# Patient Record
Sex: Female | Born: 1990 | Hispanic: No | Marital: Single | Smoking: Never smoker
Health system: Southern US, Community
[De-identification: ages and names within clinical notes are randomized; demographics above are authoritative.]

## PROBLEM LIST (undated history)

## (undated) DIAGNOSIS — T783XXA Angioneurotic edema, initial encounter: Secondary | ICD-10-CM

## (undated) DIAGNOSIS — L509 Urticaria, unspecified: Secondary | ICD-10-CM

## (undated) HISTORY — DX: Angioneurotic edema, initial encounter: T78.3XXA

## (undated) HISTORY — DX: Urticaria, unspecified: L50.9

## (undated) HISTORY — PX: SINOSCOPY: SHX187

---
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
## 2009-05-04 ENCOUNTER — Ambulatory Visit: Payer: Self-pay | Admitting: Family Medicine

## 2009-07-17 ENCOUNTER — Emergency Department (HOSPITAL_COMMUNITY): Admission: EM | Admit: 2009-07-17 | Discharge: 2009-07-17 | Payer: Self-pay | Admitting: Emergency Medicine

## 2009-07-17 DIAGNOSIS — Z9189 Other specified personal risk factors, not elsewhere classified: Secondary | ICD-10-CM | POA: Insufficient documentation

## 2009-07-24 ENCOUNTER — Ambulatory Visit: Payer: Self-pay | Admitting: Family Medicine

## 2009-09-24 ENCOUNTER — Ambulatory Visit: Payer: Self-pay | Admitting: Family Medicine

## 2009-10-06 HISTORY — PX: TONSILECTOMY/ADENOIDECTOMY WITH MYRINGOTOMY: SHX6125
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
## 2010-07-22 ENCOUNTER — Ambulatory Visit: Payer: Self-pay | Admitting: Family Medicine

## 2010-07-30 ENCOUNTER — Ambulatory Visit: Payer: Self-pay | Admitting: Family Medicine

## 2010-07-30 ENCOUNTER — Encounter: Payer: Self-pay | Admitting: Family Medicine

## 2010-07-30 LAB — CONVERTED CEMR LAB
HDL: 45 mg/dL (ref >39)
LDL Cholesterol: 96 mg/dL (ref 0–99)

## 2010-08-02 ENCOUNTER — Telehealth: Payer: Self-pay | Admitting: Family Medicine

## 2010-08-02 ENCOUNTER — Encounter: Payer: Self-pay | Admitting: Family Medicine

## 2010-10-06 HISTORY — PX: RHINOPLASTY: SHX2354

## 2010-10-06 HISTORY — PX: SEPTOPLASTY: SHX2393

## 2010-10-20 ENCOUNTER — Emergency Department (HOSPITAL_COMMUNITY)
Admission: EM | Admit: 2010-10-20 | Discharge: 2010-10-20 | Payer: Self-pay | Source: Home / Self Care | Admitting: Family Medicine

## 2010-11-05 NOTE — Progress Notes (Signed)
Summary: lab results   Phone Note Call from Patient Call back at Home Phone [phone]   Caller: Patient Summary of Call: pt is wanting lab results Initial call taken by: De Nurse,  August 02, 2010 12:27 PM  Follow-up for Phone Call        Attempted to Call pt. at 1:22 pm today.  No answer.  Will send letter with lab results.   Follow-up by: Ardyth Gal MD,  August 02, 2010 1:23 PM
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
## 2010-11-05 NOTE — Letter (Signed)
Summary: Generic Letter  [facility]  [address]   [place], [place] [address]   Phone: [phone]  Fax: [phone]    08/02/2010  NEVA RAMASWAMY [address], [place]  [address]  Dear Ms. Williams Che,  I have recieved your lab results.  Your fasting blood sugar was normal.  Your total cholesterol was also normal.  However, your triglycerides (a part of cholesterol) was just a little high.  I would encourage you to exercise for at least 30 minutes 5 days a week, and to eat a low fat diet.  You do not need to be on any medications for this.  Please contact the office if you have any questions.    Sincerely,   Ardyth Gal MD  Appended Document: Generic Letter mailed.

## 2011-01-09 LAB — POCT I-STAT, CHEM 8
BUN: 16 mg/dL (ref 6–23)
Calcium, Ion: 1.18 mmol/L (ref 1.12–1.32)
Potassium: 3.8 mEq/L (ref 3.5–5.1)
Sodium: 140 mEq/L (ref 135–145)
TCO2: 26 mmol/L (ref 0–100)

## 2011-01-09 LAB — URINALYSIS, ROUTINE W REFLEX MICROSCOPIC
Bilirubin Urine: NEGATIVE
Hgb urine dipstick: NEGATIVE
Ketones, ur: NEGATIVE mg/dL
Nitrite: NEGATIVE
Protein, ur: NEGATIVE mg/dL
Urobilinogen, UA: 0.2 mg/dL (ref 0.0–1.0)

## 2011-01-09 LAB — POCT PREGNANCY, URINE: Preg Test, Ur: NEGATIVE

## 2011-02-21 NOTE — Op Note (Signed)
NAMECHAUNTAY, PASZKIEWICZ            ACCOUNT NO.:  [id number]   MEDICAL RECORD NO.:  [id number]          PATIENT TYPE:  AMB   LOCATION:  [facility]                          FACILITY:  [facility]   PHYSICIAN:  Christopher E. Ezzard Standing, M.D.DATE OF BIRTH:  Feb 18, 1991   DATE OF PROCEDURE:  09/25/2006  DATE OF DISCHARGE:                               OPERATIVE REPORT   PREOPERATIVE DIAGNOSIS:  Recurrent tonsillitis with moderate  adenotonsillar hypertrophy.   POSTOPERATIVE DIAGNOSIS:  Recurrent tonsillitis with moderate  adenotonsillar hypertrophy.   OPERATION:  Tonsillectomy and adenoidectomy.   SURGEON:  Kristine Garbe. Ezzard Standing, M.D.   ANESTHESIA:  General endotracheal.   COMPLICATIONS:  None.   BRIEF CLINICAL NOTE:  Makynna is a 20-year-old who has had a history of  recurrent tonsil infections for a number of years.  She has had 4-5  infections this past year with a large swollen tonsils.  She is taken to  the operating room at this time for tonsillectomy and possible  adenoidectomy.   DESCRIPTION OF PROCEDURE:  After adequate endotracheal anesthesia,  Ashlee received 1 gram Ancef IV preoperatively as well as 10 mg  Decadron.  A mouth gag was used to expose oropharynx.  The left and  right tonsils were dissected from the tonsillar fossae using cautery.  Care was taken to preserve the anterior posterior tonsillar pillars as  well as the uvula. After obtaining adequate hemostasis, the nasopharynx  was imaged.  Shakenya had moderate adenoid hypertrophy and using suction  cautery, the residual adenoid tissue was cauterized.  This completed the  procedure.  The nose and nasopharynx was irrigated with saline.  Katti  was awakened from anesthesia and transferred to recovery postop doing  well.   DISPOSITION:  Miesha is discharged home later this morning on  amoxicillin suspension 5 mg b.i.d. for one week, Tylenol Lortab elixir 1  to 1 1/2 tbs q.4h. p.r.n. pain.  We will have her follow up in my  office  in 10-14 days for recheck.           ______________________________  Kristine Garbe. Ezzard Standing, M.D.     CEN/MEDQ  D:  09/25/2006  T:  09/25/2006  Job:  [id number]

## 2011-08-15 ENCOUNTER — Ambulatory Visit (INDEPENDENT_AMBULATORY_CARE_PROVIDER_SITE_OTHER): Payer: PRIVATE HEALTH INSURANCE | Admitting: *Deleted

## 2011-08-15 DIAGNOSIS — Z23 Encounter for immunization: Secondary | ICD-10-CM

## 2011-09-11 ENCOUNTER — Ambulatory Visit (INDEPENDENT_AMBULATORY_CARE_PROVIDER_SITE_OTHER): Payer: PRIVATE HEALTH INSURANCE | Admitting: Family Medicine

## 2011-09-11 VITALS — BP 137/92 | HR 113 | Temp 97.9°F | Ht 63.0 in | Wt 149.0 lb

## 2011-09-11 DIAGNOSIS — N921 Excessive and frequent menstruation with irregular cycle: Secondary | ICD-10-CM | POA: Insufficient documentation

## 2011-09-11 DIAGNOSIS — N912 Amenorrhea, unspecified: Secondary | ICD-10-CM

## 2011-09-11 LAB — POCT URINE PREGNANCY: Preg Test, Ur: NEGATIVE

## 2011-09-11 MED ORDER — NORELGESTROMIN-ETH ESTRADIOL 150-35 MCG/24HR TD PTWK: 1.0000 | MEDICATED_PATCH | TRANSDERMAL | Status: DC

## 2011-09-13 ENCOUNTER — Encounter: Payer: Self-pay | Admitting: Family Medicine

## 2011-09-13 NOTE — Progress Notes (Signed)
  Subjective:    Patient ID: Kathryn French, female    DOB: 04-16-1991, 20 y.o.   MRN: [id number]  HPI 20 year old female who presents with complaint of irregular periods and cramping during periods. She was on birth control pills 2 years ago which were prescribed to her for symptoms of metrorrhagia and dysmenorrhea. She was on birth control pills for 1-1/2 years but stopped because she was missing pills. Interval between periods range is between 2 weeks to 6 weeks. Last menstrual period was 2 weeks ago. Periods last one and a half days. They are light and accompanied by cramping. She reports always being irregular since menarche which was at 20 years old. She would like to start birth control again so that her periods can be regular and cramping controlled. Birth control pills helped with these symptoms in the past as well as with control of her acne. She denies any history of headaches or chest pain or shortness of breath using birth control pills in the past. She does not smoke. She reports never being sexually active and wants birth control only to regulate her period. She has done some research and would like to try either the NuvaRing or the patch. She will be leaving the country tomorrow to go to [place].      Review of Systems  negative except per history of present illness     Objective:   Physical Exam Filed Vitals:   09/11/11 1420  BP: 137/92  Pulse: 113  Temp: 97.9 F (36.6 C)    Repeat BP: 104/70 Physical Examination: General appearance - alert, well appearing, and in no distress Chest - clear to auscultation, no wheezes, rales or rhonchi, symmetric air entry Heart - normal rate, regular rhythm, normal S1, S2, no murmurs, rubs, clicks or gallops Abdomen - soft, nontender, nondistended, no masses or organomegaly     Assessment & Plan:

## 2011-09-13 NOTE — Assessment & Plan Note (Addendum)
Patient elected to start patch since she was concerned that inserting the nuvaring in a setting of lower water sanitation like [place] would perhaps be a concern. Patient's blood pressure was originally elevated but on repeat blood pressure was normal. No contraindication to prescribing the patch was therefore found. Patient denied any skin sensitivity to tapes and topical agents. Patient was told about potential of blood clots on the patch and was advised to stay mobile during her long plane flight to [place] given that this would also be a factor in clot formation. Patient was warned about symptoms of stroke and blood clots and advised to stop patch immediately if this were to occur.  No need to evaluate causes of metrorrhagia such as PCOS for now.

## 2013-12-14 ENCOUNTER — Encounter: Payer: Self-pay | Admitting: Family Medicine

## 2013-12-14 ENCOUNTER — Ambulatory Visit (INDEPENDENT_AMBULATORY_CARE_PROVIDER_SITE_OTHER): Payer: BC Managed Care – PPO | Admitting: Family Medicine

## 2013-12-14 VITALS — BP 123/86 | HR 89 | Temp 97.8°F | Wt 168.0 lb

## 2013-12-14 DIAGNOSIS — R21 Rash and other nonspecific skin eruption: Secondary | ICD-10-CM

## 2013-12-14 MED ORDER — TRIAMCINOLONE ACETONIDE 0.1 % EX CREA: 1.0000 "application " | TOPICAL_CREAM | Freq: Two times a day (BID) | CUTANEOUS | Status: DC

## 2013-12-14 NOTE — Patient Instructions (Signed)
Please try benadryl or zyrtec for your itching.  Use the steroid cream on the rash. If the rash does not improve in the next couple of weeks please let us know. If you develop fevers or start to feel ill please let us know.

## 2013-12-14 NOTE — Assessment & Plan Note (Signed)
Patient with rash that appears allergic vs eczematous in nature. Has not tried any medications for this. Discussed use of benadryl or zyrtec for itching. Given prescription for triamcinolone to apply BID until improved. If not better in the next couple of weeks she is to f/u. Discussed return precautions.

## 2013-12-14 NOTE — Progress Notes (Signed)
Patient ID: Kathryn AllegraRafiya French, female   DOB: 12/09/1990, 23 y.o.   MRN: [id number]  Kathryn AlarEric Mohamud Mrozek, MD Phone: [phone]  Kathryn French is a 23 y.o. female who presents today for rash.  Has been present for a couple of weeks. Has been getting worse. 1st on her back and waist, this resolved and now on her right forearm and hand. Itches. No fevers. No changes in soaps, detergents. No new pets. No recent stays in hotels. No history of previous rash. Has not taken anything for this previously. Notes only triggers she could think of may be eggplant or shrimp. 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  Patient is a nonsmoker.  ROS: Per HPI   Physical Exam Filed Vitals:   12/14/13 1553  BP: 123/86  Pulse: 89  Temp: 97.8 F (36.6 C)    Physical Examination: General appearance - alert, well appearing, and in no distress Skin - right forearm with dollar sized patch of erythema and papules, there are excoriations within this area, there is a similar smaller patch over her right 5th MCP joint   Assessment/Plan: Please see individual problem list.

## 2013-12-15 ENCOUNTER — Telehealth: Payer: Self-pay | Admitting: Family Medicine

## 2013-12-15 NOTE — Telephone Encounter (Signed)
I called patients home number to discuss her concern and she was not available. Her father stated she would be in to the office later today to pick up her fathers forms.

## 2013-12-15 NOTE — Telephone Encounter (Signed)
Please call Kathryn French back regarding her rash.  It's getting worse and she need advice.

## 2014-03-21 ENCOUNTER — Telehealth: Payer: Self-pay | Admitting: Family Medicine

## 2014-03-28 ENCOUNTER — Ambulatory Visit: Payer: BC Managed Care – PPO

## 2014-06-05 ENCOUNTER — Telehealth: Payer: Self-pay | Admitting: Family Medicine

## 2014-06-05 NOTE — Telephone Encounter (Signed)
Will forward to MD to see what he suggests.  Jazmin Hartsell,CMA  

## 2014-06-05 NOTE — Telephone Encounter (Signed)
For cough control the patient can try over the counter robitussin or mucinex DM. She should really be seen sooner than that and should be advised that if she has fever, productive cough, shortness of breath, or chest pain she has to been seen sooner than that. Thanks.

## 2014-06-05 NOTE — Telephone Encounter (Signed)
Pt called and made an appointment with Dr. Birdie Sons for 9/15, but in the meantime would like to know what she can do for her cough. She only wanted to see Dr. Birdie Sons. Please call and discuss with her. jw

## 2014-06-05 NOTE — Telephone Encounter (Signed)
LM for patient to call back.  Please give message from MD. Jazmin Hartsell,CMA  

## 2014-06-08 ENCOUNTER — Encounter: Payer: Self-pay | Admitting: Family Medicine

## 2014-06-08 ENCOUNTER — Ambulatory Visit (INDEPENDENT_AMBULATORY_CARE_PROVIDER_SITE_OTHER): Payer: BC Managed Care – PPO | Admitting: Family Medicine

## 2014-06-08 VITALS — BP 110/70 | HR 88 | Temp 98.3°F | Ht 62.75 in | Wt 162.7 lb

## 2014-06-08 DIAGNOSIS — R059 Cough, unspecified: Secondary | ICD-10-CM | POA: Diagnosis not present

## 2014-06-08 DIAGNOSIS — R05 Cough: Secondary | ICD-10-CM | POA: Diagnosis not present

## 2014-06-08 LAB — CBC WITH DIFFERENTIAL/PLATELET
BASOS ABS: 0 10*3/uL (ref 0.0–0.1)
BASOS PCT: 0 % (ref 0–1)
EOS PCT: 1 % (ref 0–5)
Eosinophils Absolute: 0.1 10*3/uL (ref 0.0–0.7)
HEMATOCRIT: 41.1 % (ref 36.0–46.0)
HEMOGLOBIN: 13.6 g/dL (ref 12.0–15.0)
LYMPHS PCT: 28 % (ref 12–46)
Lymphs Abs: 2.3 10*3/uL (ref 0.7–4.0)
MCH: 27.5 pg (ref 26.0–34.0)
MCHC: 33.1 g/dL (ref 30.0–36.0)
MCV: 83.2 fL (ref 78.0–100.0)
Monocytes Absolute: 0.4 10*3/uL (ref 0.1–1.0)
Monocytes Relative: 5 % (ref 3–12)
Neutro Abs: 5.3 10*3/uL (ref 1.7–7.7)
Neutrophils Relative %: 66 % (ref 43–77)
Platelets: 251 10*3/uL (ref 150–400)
RBC: 4.94 MIL/uL (ref 3.87–5.11)
RDW: 13.8 % (ref 11.5–15.5)
WBC: 8.1 10*3/uL (ref 4.0–10.5)

## 2014-06-08 MED ORDER — BENZONATATE 100 MG PO CAPS: 100.0000 mg | ORAL_CAPSULE | Freq: Two times a day (BID) | ORAL | Status: DC | PRN

## 2014-06-08 MED ORDER — AZITHROMYCIN 250 MG PO TABS: ORAL_TABLET | ORAL | Status: DC

## 2014-06-08 NOTE — Patient Instructions (Signed)
Pneumonia Pneumonia is an infection of the lungs.  CAUSES Pneumonia may be caused by bacteria or a virus. Usually, these infections are caused by breathing infectious particles into the lungs (respiratory tract). SIGNS AND SYMPTOMS   Cough.  Fever.  Chest pain.  Increased rate of breathing.  Wheezing.  Mucus production. DIAGNOSIS  If you have the common symptoms of pneumonia, your health care provider will typically confirm the diagnosis with a chest X-ray. The X-ray will show an abnormality in the lung (pulmonary infiltrate) if you have pneumonia. Other tests of your blood, urine, or sputum may be done to find the specific cause of your pneumonia. Your health care provider may also do tests (blood gases or pulse oximetry) to see how well your lungs are working. TREATMENT  Some forms of pneumonia may be spread to other people when you cough or sneeze. You may be asked to wear a mask before and during your exam. Pneumonia that is caused by bacteria is treated with antibiotic medicine. Pneumonia that is caused by the influenza virus may be treated with an antiviral medicine. Most other viral infections must run their course. These infections will not respond to antibiotics.  HOME CARE INSTRUCTIONS   Cough suppressants may be used if you are losing too much rest. However, coughing protects you by clearing your lungs. You should avoid using cough suppressants if you can.  Your health care provider may have prescribed medicine if he or she thinks your pneumonia is caused by bacteria or influenza. Finish your medicine even if you start to feel better.  Your health care provider may also prescribe an expectorant. This loosens the mucus to be coughed up.  Take medicines only as directed by your health care provider.  Do not smoke. Smoking is a common cause of bronchitis and can contribute to pneumonia. If you are a smoker and continue to smoke, your cough may last several weeks after your  pneumonia has cleared.  A cold steam vaporizer or humidifier in your room or home may help loosen mucus.  Coughing is often worse at night. Sleeping in a semi-upright position in a recliner or using a couple pillows under your head will help with this.  Get rest as you feel it is needed. Your body will usually let you know when you need to rest. PREVENTION A pneumococcal shot (vaccine) is available to prevent a common bacterial cause of pneumonia. This is usually suggested for:  People over 65 years old.  Patients on chemotherapy.  People with chronic lung problems, such as bronchitis or emphysema.  People with immune system problems. If you are over 65 or have a high risk condition, you may receive the pneumococcal vaccine if you have not received it before. In some countries, a routine influenza vaccine is also recommended. This vaccine can help prevent some cases of pneumonia.You may be offered the influenza vaccine as part of your care. If you smoke, it is time to quit. You may receive instructions on how to stop smoking. Your health care provider can provide medicines and counseling to help you quit. SEEK MEDICAL CARE IF: You have a fever. SEEK IMMEDIATE MEDICAL CARE IF:   Your illness becomes worse. This is especially true if you are elderly or weakened from any other disease.  You cannot control your cough with suppressants and are losing sleep.  You begin coughing up blood.  You develop pain which is getting worse or is uncontrolled with medicines.  Any of the symptoms   which initially brought you in for treatment are getting worse rather than better.  You develop shortness of breath or chest pain. MAKE SURE YOU:   Understand these instructions.  Will watch your condition.  Will get help right away if you are not doing well or get worse. Document Released: 09/22/2005 Document Revised: 02/06/2014 Document Reviewed: 12/12/2010 [facility] Patient Information 2015  [place], [place]. This information is not intended to replace advice given to you by your health care provider. Make sure you discuss any questions you have with your health care provider.  Pertussis Pertussis (whooping cough) is an infection that causes severe and sudden coughing attacks. CAUSES  Pertussis is caused by bacteria. It is very contagious and spreads to others by the droplets sprayed in the air when an infected person talks, coughs, and sneezes. You may have caught pertussis from inhaling these droplets or from touching a surface where the droplets fell and then touching your mouth or nose. SIGNS AND SYMPTOMS  Early during this infection, symptoms of pertussis are similar to those of the common cold. They include a runny nose, low fever, mild cough, and red, watery eyes. After 1-2 weeks the cold symptoms get better, but the cough worsens and severe and sudden coughing attacks frequently develop. During these attacks people may cough so hard that vomiting occurs. Over the next month to 6 weeks, the cough starts to get better, but it may take as long as 6 months for the cough to go away completely. DIAGNOSIS  Your health care provider will perform a physical exam. The health care provider may take a mucus sample from the nose and throat and a blood sample to help confirm the diagnosis. The health care provider may also take a chest X-ray. TREATMENT  Antibiotic medicines are usually prescribed for this infection. Starting antibiotics quickly may help shorten the illness and make it less contagious. Antibiotics may also be prescribed for everyone living in the same household. Immunization may be recommended for those in the household at risk of developing pertussis. At-risk groups include:  Infants.  Those who have not had their full course of pertussis immunizations.  Those who were immunized but have not had their recent booster shot. Mild coughing may continue for months after the  infection is treated from the remaining soreness and inflammation in the lungs. HOME CARE INSTRUCTIONS   If you were prescribed an antibiotic medicine, finish it all even if you start to feel better.  Do not take cough medicine unless prescribed by your health care provider. Coughing is a protective mechanism that helps keep colored mucus (sputum) and secretions from clogging breathing passages.  Stay away from those who are at risk of developing pertussis for the first 5 days of antibiotic treatment. If no antibiotics are prescribed, stay at home for the first 3 weeks you are coughing.  Do not go to work until you have been treated with antibiotics for 5 days. If no antibiotics are prescribed, do not go to work for the first 3 weeks you are coughing. Inform your workplace that you were diagnosed with pertussis.  Wash your hands often. Those living in the same household should also wash their hands often to avoid spreading the infection.  Avoid substances that may irritate the lungs, such as smoke, aerosols, or fumes. These substances may worsen your coughing.  If you are having a coughing attack:  Raise the head of your mattress to help clear sputum more easily and improve breathing.  Sit  upright.  Use a cool mist humidifier at home to increase air moisture. This will soothe your cough and help loosen sputum. Do not use hot steam.  Rest as much as possible. Normal activity may be gradually resumed.  Drink enough fluids to keep your urine clear or pale yellow. PREVENTION  Pertussis can be prevented with a vaccine and later booster shots. The pertussis vaccine is usually given during childhood. Adults who were not previously vaccinated should be vaccinated as soon as possible. Adults who were previously vaccinated should talk to their health care providers about the need for a booster shot because immunity from the vaccine decreases over time. All of the following persons should consider  receiving a booster dose of pertussis, which is combined with tetanus and diphtheria (Tdap) vaccine:  Pregnant women during each pregnancy, preferably at 27-36 weeks of pregnancy (gestation).  All persons who have or will have close contact with an infant aged less than 12 months. Infants are at highest risk for life-threatening complications from pertussis.  All health care personnel. SEEK MEDICAL CARE IF:   You have persistent vomiting.  You are not able to eat or drink fluids.  You do not seem to be improving.  You have a fever. SEEK IMMEDIATE MEDICAL CARE IF:   Your face turns red or blue during a coughing attack.  You become unconscious after a coughing attack, even if only for a few moments.  Your breathing stops for a period of time (apnea).  You are restless or cannot sleep.  You are listless or sleeping too much. MAKE SURE YOU:  Understand these instructions.   Will watch your condition.   Will get help right away if you are not doing well or get worse. Document Released: 01/17/2013 Document Revised: 02/06/2014 Document Reviewed: 01/17/2013 [facility] Patient Information 2015 [place], [place]. This information is not intended to replace advice given to you by your health care provider. Make sure you discuss any questions you have with your health care provider.  It was a pleasure seeing you today. I will call you with the results of your blood work and chest x-ray once they're available. Have called shoe and azithromycin and Tessalon Perles to take, azithromycin as your antibiotic and a Tessalon Perles to help you with your cough. We'll need to followup with you in 2 weeks.

## 2014-06-08 NOTE — Assessment & Plan Note (Addendum)
Concern for pneumonia versus pertussis. Considering post cough emesis in length of cough presence. Chest x-ray to rule out pneumonia Pertussis PCR>> unable to obtain in the office. CBC Tessalon Perles for cough Z-Pak prescribed

## 2014-06-08 NOTE — Progress Notes (Signed)
   Subjective:    Patient ID: Kathryn French, female    DOB: [birth year], 23 y.o.   MRN: [id number]  HPI Kathryn French is a 23 y.o. same-day appointment  Cough/fever: Patient states she's had a cough for greater than 3 weeks. She has tried over-the-counter medications without significant result. She states she lost her voice one week prior to the onset of her cough. Her cough can produce a thick sputum at times and at other times she has a post tussive emesis. She endorses fluctuating fever, nausea, rhinorrhea, fatigue, cough and posttussive emesis only. She denies diarrhea, shortness of breath or headache. 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 No one at home was sick prior to her illness, however her mother is sick now.  Patient is a nonsmoker  No past medical history on file.   Review of Systems Per history of present illness    Objective:   Physical Exam BP 110/70  Pulse 88  Temp(Src) 98.3 F (36.8 C) (Oral)  Ht 5' 2.75" (1.594 m)  Wt 162 lb 11.2 oz (73.8 kg)  BMI 29.05 kg/m2 Gen: Pleasant female, appears fatigued, in no acute distress, nontoxic in appearance. Cough present. HEENT: AT. [place]. Bilateral TM visualized and normal in appearance. Bilateral eyes without injections or icterus. MMM. Bilateral nares without erythema and swelling. Throat without erythema or exudates.  CV: RRR no murmur Chest: CTAB, no wheeze or crackles. Normal work of breath Abd: Soft. Flat. NTND. BS present. No Masses palpated.  Ext: No erythema. No edema.      Assessment & Plan:

## 2014-06-09 ENCOUNTER — Ambulatory Visit (HOSPITAL_COMMUNITY)
Admission: RE | Admit: 2014-06-09 | Discharge: 2014-06-09 | Disposition: A | Discharge status: Home / Self Care | Payer: BC Managed Care – PPO | Source: Ambulatory Visit | Attending: Family Medicine | Admitting: Family Medicine

## 2014-06-09 ENCOUNTER — Ambulatory Visit: Payer: BC Managed Care – PPO

## 2014-06-09 ENCOUNTER — Telehealth: Payer: Self-pay | Admitting: Family Medicine

## 2014-06-09 DIAGNOSIS — R05 Cough: Secondary | ICD-10-CM

## 2014-06-09 DIAGNOSIS — R059 Cough, unspecified: Secondary | ICD-10-CM | POA: Diagnosis present

## 2014-06-09 NOTE — Telephone Encounter (Signed)
Please call Kathryn French back and inform her that her labs were normal. I see she did not go get an xray as suggested. I encourage her to do so, otherwise we do not know if she has a pneumonia or other potential cause of her symptoms. I hope she is feeling better. Thanks.

## 2014-06-13 ENCOUNTER — Telehealth: Payer: Self-pay | Admitting: Family Medicine

## 2014-06-13 NOTE — Telephone Encounter (Signed)
Message given. She has finished antibotics yesterday. Still has nausea and vomiting Please advise

## 2014-06-13 NOTE — Telephone Encounter (Signed)
Please call Kathryn French, her CXR was without signs of pneumonia. I hope she is feeling better with the antibiotics and tessalon pearls. Please advise her, her cough may continue for weeks after she stops antibiotics. Thanks.

## 2014-06-13 NOTE — Telephone Encounter (Signed)
Left message to return call. She has an appointment tomorrow for nurse visit. If patient is ok to wait until tomorrow she can be evaluated by nurse and added to the cross cover schedule.Busick, Rodena Medin

## 2014-06-14 ENCOUNTER — Ambulatory Visit (INDEPENDENT_AMBULATORY_CARE_PROVIDER_SITE_OTHER): Payer: BC Managed Care – PPO | Admitting: *Deleted

## 2014-06-14 ENCOUNTER — Other Ambulatory Visit: Payer: Self-pay | Admitting: *Deleted

## 2014-06-14 DIAGNOSIS — Z23 Encounter for immunization: Secondary | ICD-10-CM | POA: Diagnosis not present

## 2014-06-14 DIAGNOSIS — N921 Excessive and frequent menstruation with irregular cycle: Secondary | ICD-10-CM

## 2014-06-14 NOTE — Telephone Encounter (Signed)
Pt request to restart birth control patch. Pt has been off patch for about a couple of months.  Clovis Pu, RN

## 2014-06-15 NOTE — Telephone Encounter (Signed)
Please advise the patient that she will need to come in for an office visit prior to restarting this medication. Thanks.

## 2014-06-15 NOTE — Telephone Encounter (Signed)
aptt made.  Pt agreeable. Kathryn French, Kathryn French

## 2014-06-20 ENCOUNTER — Ambulatory Visit: Payer: BC Managed Care – PPO | Admitting: Family Medicine

## 2014-06-22 ENCOUNTER — Ambulatory Visit: Payer: BC Managed Care – PPO | Admitting: Family Medicine

## 2014-07-05 ENCOUNTER — Ambulatory Visit: Payer: BC Managed Care – PPO | Admitting: Family Medicine

## 2014-08-09 ENCOUNTER — Encounter: Payer: BC Managed Care – PPO | Admitting: Family Medicine

## 2014-10-06 HISTORY — PX: WISDOM TOOTH EXTRACTION: SHX21

## 2014-12-11 ENCOUNTER — Other Ambulatory Visit: Payer: Self-pay | Admitting: Family Medicine

## 2014-12-11 MED ORDER — TRIAMCINOLONE ACETONIDE 0.1 % EX CREA: 1.0000 "application " | TOPICAL_CREAM | Freq: Two times a day (BID) | CUTANEOUS | Status: DC

## 2014-12-11 NOTE — Telephone Encounter (Signed)
Patient called and needs a refill on her allergy cream called in. jw

## 2014-12-14 ENCOUNTER — Encounter: Payer: Self-pay | Admitting: Family Medicine

## 2014-12-14 ENCOUNTER — Ambulatory Visit (INDEPENDENT_AMBULATORY_CARE_PROVIDER_SITE_OTHER): Payer: BLUE CROSS/BLUE SHIELD | Admitting: Family Medicine

## 2014-12-14 VITALS — BP 111/80 | HR 86 | Temp 99.7°F | Wt 152.0 lb

## 2014-12-14 DIAGNOSIS — R059 Cough, unspecified: Secondary | ICD-10-CM

## 2014-12-14 DIAGNOSIS — J111 Influenza due to unidentified influenza virus with other respiratory manifestations: Secondary | ICD-10-CM | POA: Insufficient documentation

## 2014-12-14 DIAGNOSIS — R05 Cough: Secondary | ICD-10-CM

## 2014-12-14 MED ORDER — BENZONATATE 100 MG PO CAPS: 100.0000 mg | ORAL_CAPSULE | Freq: Two times a day (BID) | ORAL | Status: DC | PRN

## 2014-12-14 NOTE — Patient Instructions (Signed)
It was good to see you today. You possibly have the flu but definitely have some viral illness. I am prescribing Tessalon Perles for use for your cough. You can also use honey. Rest and drink plenty of warm fluids. You can use ibuprofen or Tylenol for muscle aches. You can use Neti pot or nasal saline for your nasal congestion. Follow-up in 1 week if you're not feeling any better. Seek immediate care if you have trouble breathing or trouble staying hydrated.

## 2014-12-14 NOTE — Assessment & Plan Note (Signed)
Most likely the flu versus other viral illness. Given the fact that symptoms have been present for 4 days now and pt has no chronic medical illness like DM or asthma, will not treat with Tamiflu. Vitals normal, lungs clear. -Tessalon Perles for most bothersome symptom of cough. Honey when necessary. -Rest and warm liquids. Ibuprofen or Tylenol PRN m aches. -Neti pot or nasal saline for congestion. -Handwashing. -Follow-up 1 week if no better or immediately if having trouble breathing or inability to maintain hydration

## 2014-12-14 NOTE — Progress Notes (Signed)
Patient ID: Rebeca AllegraRafiya Devin, female   DOB: 05/07/1991, 24 y.o.   MRN: [id number] Subjective:   CC: Flulike symptoms  HPI:   Patient presents to same-day clinic with 4 days of flulike symptoms including cough with whitish sputum, sneezing, nasal congestion, decreased appetite, vomiting about 2 times per day. She also has had fever since then with Tmax at 102F 2 days ago. Her father has recently had similar symptoms and now her mother has the same symptoms. She is not having diarrhea and in fact has not had a stool in 2 days. She has some body aches for which Tylenol is helping. She is able to tolerate 5-6 cups of water per day. She is urinating normally. She has bilateral abdominal pain when she coughs.  Review of Systems - Per HPI.   PMH- acne, cough, rash, allergic rhinitis, metrorrhagia    Objective:  Physical Exam BP 111/80 mmHg  Pulse 86  Temp(Src) 99.7 F (37.6 C) (Oral)  Wt 152 lb (68.947 kg)  SpO2 99%  LMP 12/05/2014 (Approximate) GEN: NAD Cardiovascular: Regular rate and rhythm, no murmurs rubs or gallops, 2+ bilateral radial pulses Pulmonary: Clear to auscultation bilaterally, normal effort, occasional dry sounding cough HEENT: Atraumatic, normocephalic, sclera clear, extra ocular movements intact, oropharynx clear, moist mucous membranes, neck supple, shoddy anterior cervical lymphadenopathy, TMs clear with retraction bilaterally, no sinus tenderness Abdomen: Soft, nontender, nondistended, NABS Extremities: No lower extremity edema or calf tenderness     Assessment:     Abrey Williams CheChowdhury is a 24 y.o. female here for  flulike symptoms.    Plan:     # See problem list and after visit summary for problem-specific plans.   Follow-up: Follow up in 1 week if no improvement in flulike symptoms.   Leona SingletonMaria T Anay Walter, MD [facility]

## 2014-12-21 ENCOUNTER — Other Ambulatory Visit: Payer: Self-pay | Admitting: Family Medicine

## 2014-12-21 DIAGNOSIS — R059 Cough, unspecified: Secondary | ICD-10-CM

## 2014-12-21 DIAGNOSIS — R05 Cough: Secondary | ICD-10-CM

## 2014-12-21 NOTE — Telephone Encounter (Signed)
Needs refill on cough medicine

## 2014-12-22 MED ORDER — BENZONATATE 100 MG PO CAPS
100.0000 mg | ORAL_CAPSULE | Freq: Two times a day (BID) | ORAL | Status: DC | PRN
Start: 2014-12-22 — End: 2015-03-26

## 2015-02-05 IMAGING — CR DG CHEST 2V
2 series · 2 of 2 positions shown · non-contrast
Comparison: None.

CLINICAL DATA: Chest congestion, tightness, cough

EXAM:
CHEST  2 VIEW

[w chest pa]
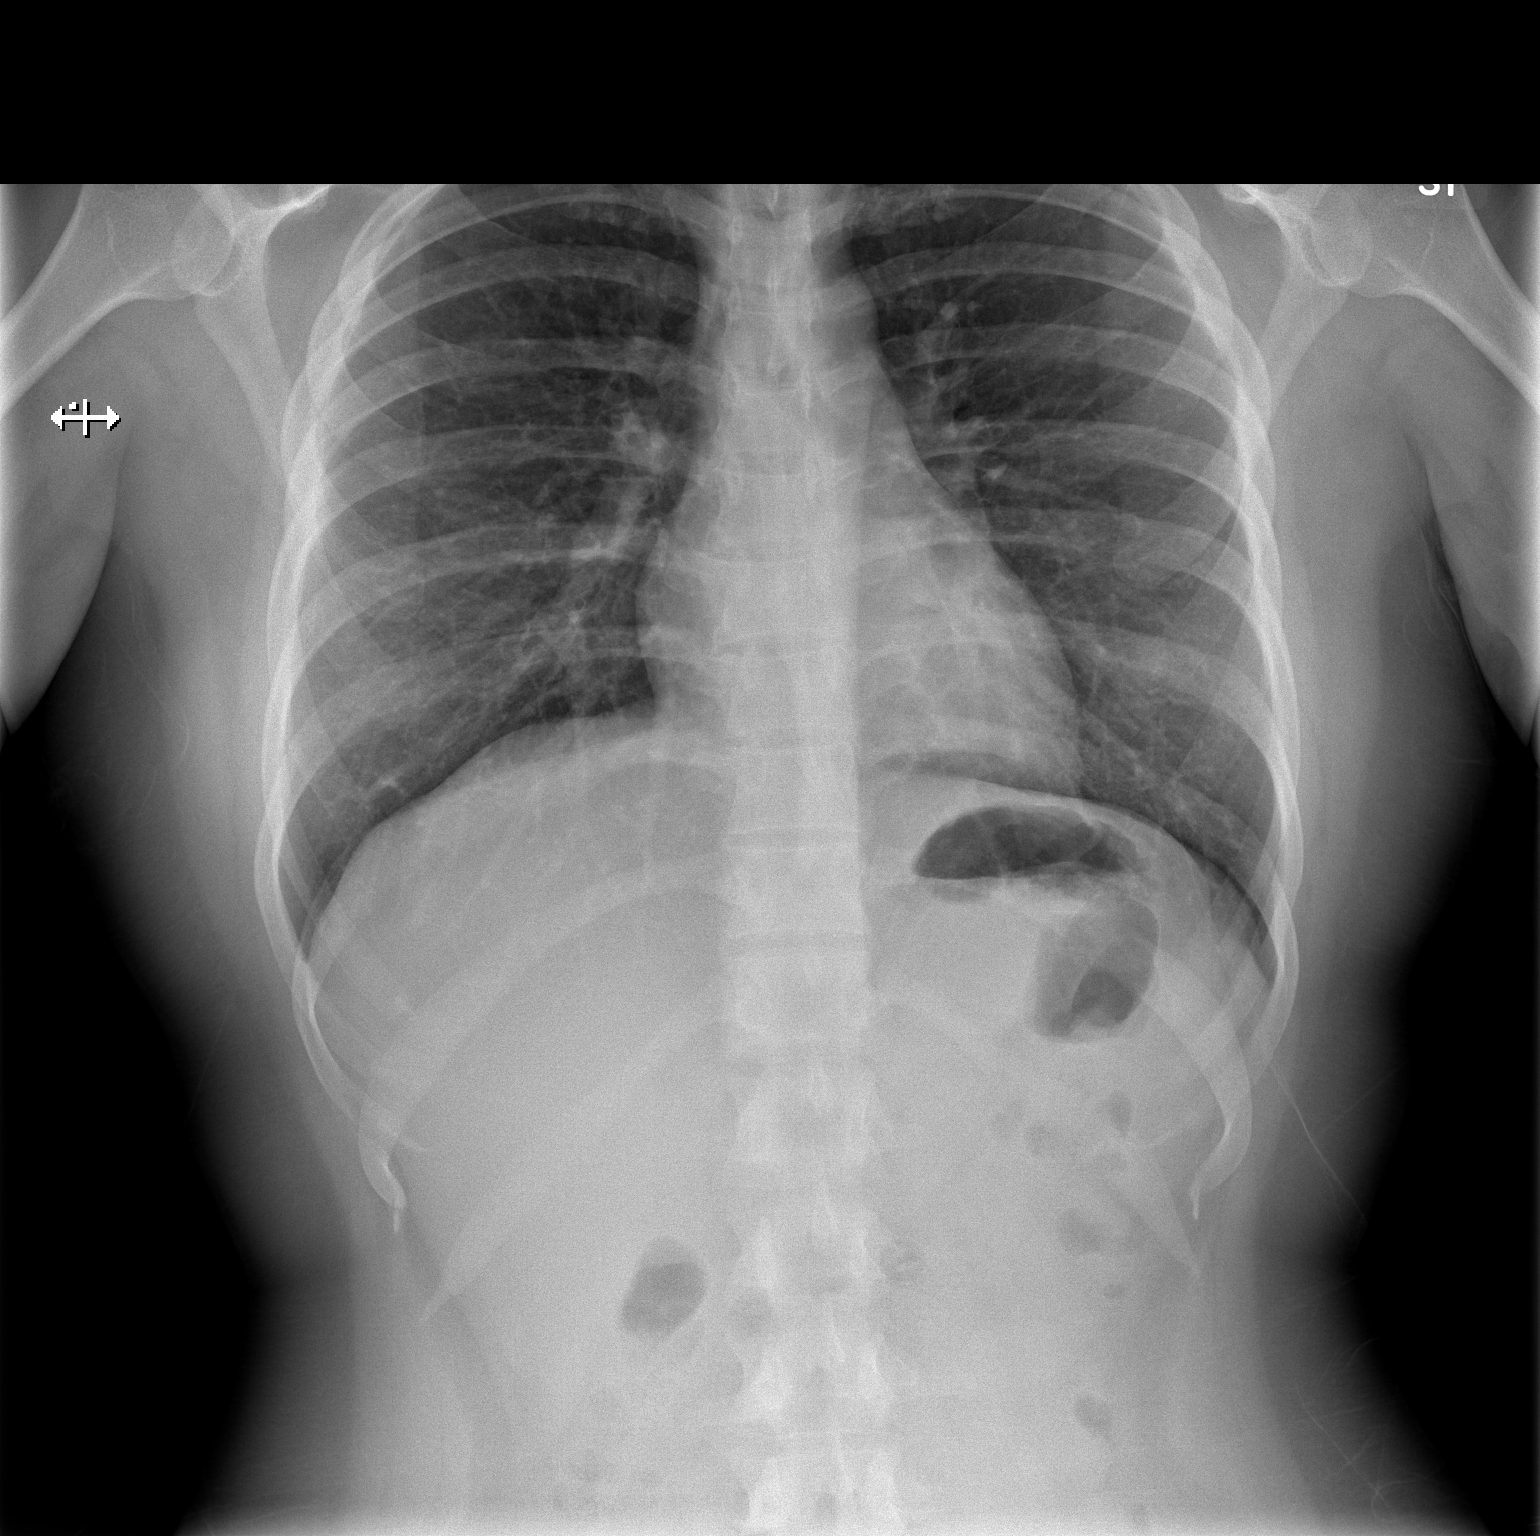

[w chest lat]
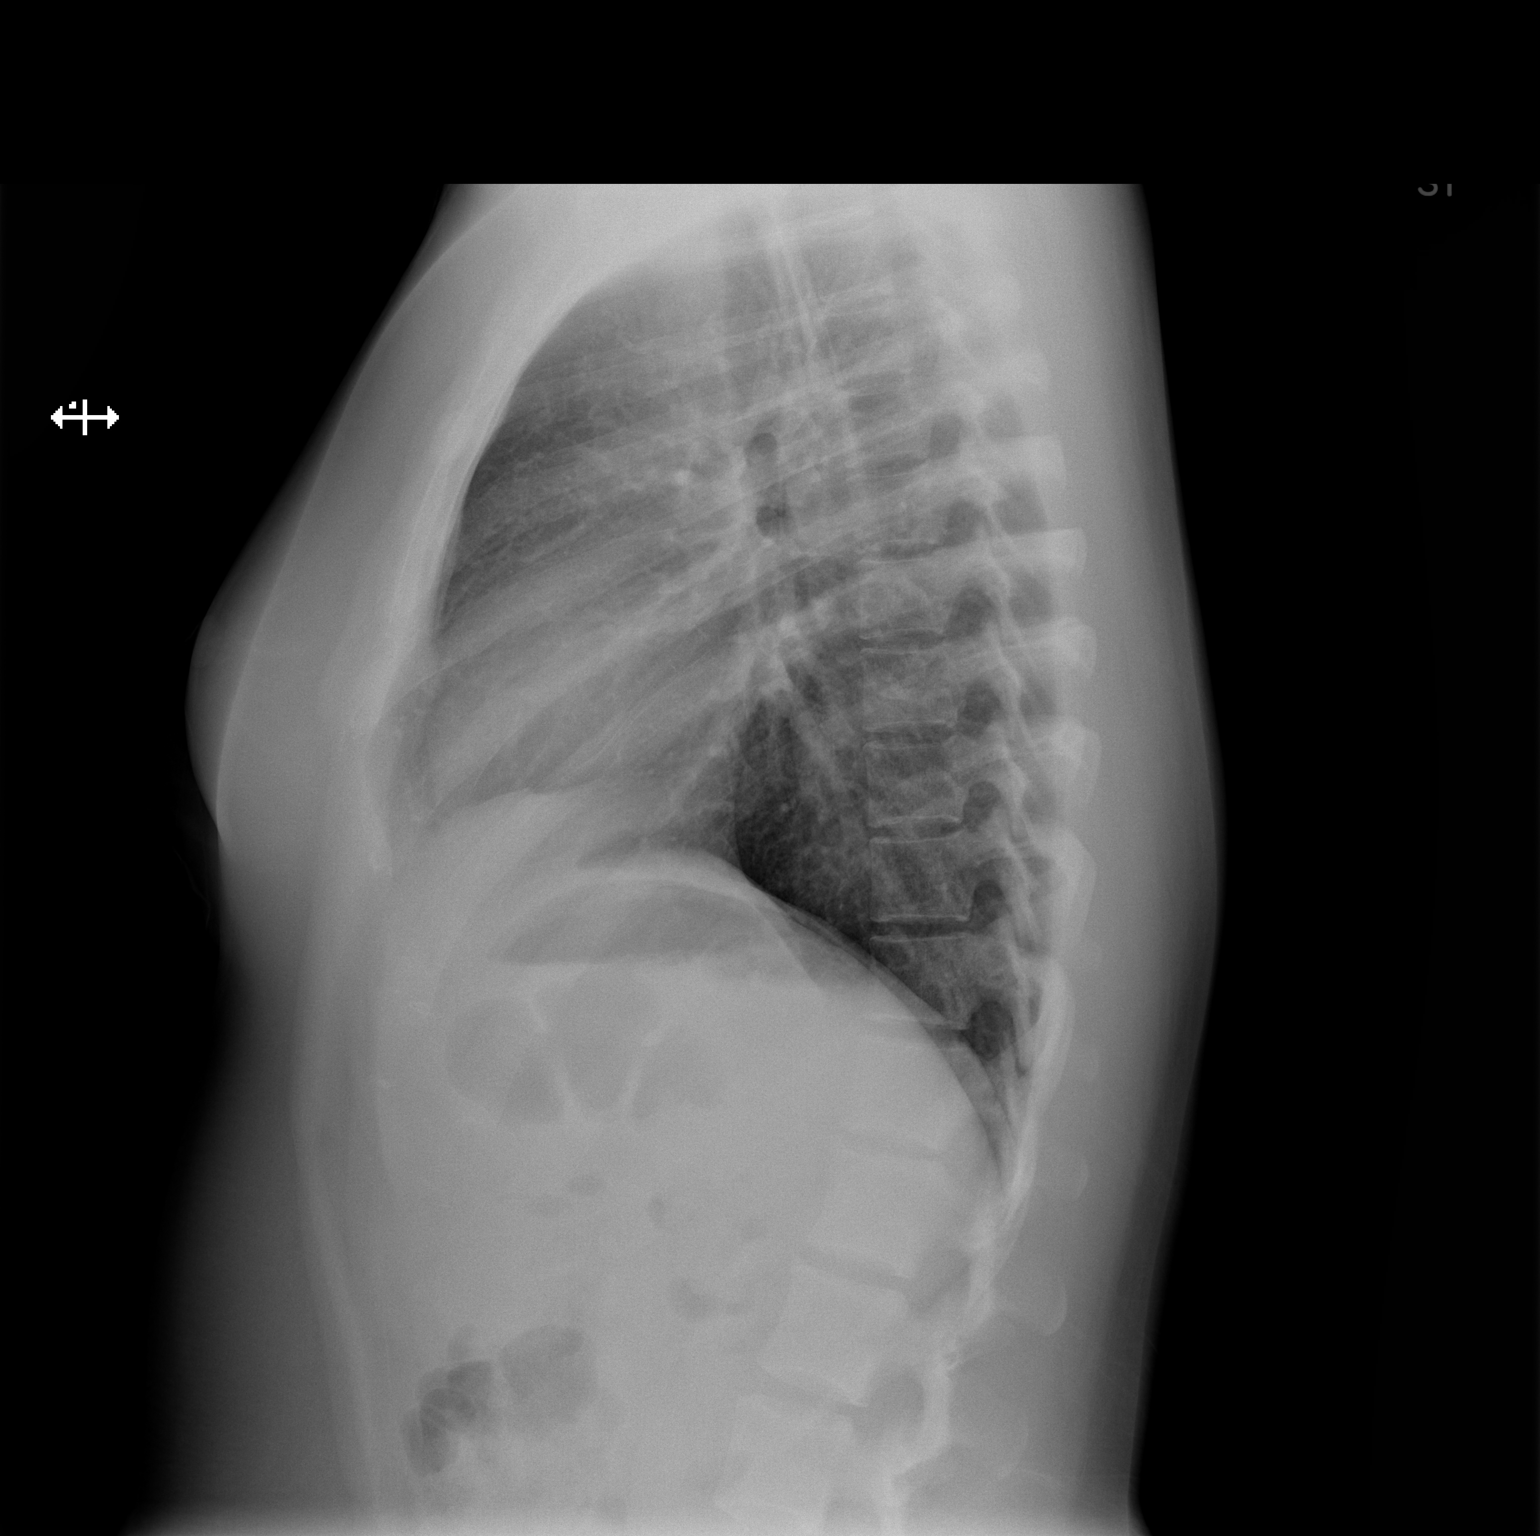

[2 of 2 positions shown; findings below may reference images not displayed]

FINDINGS: No active infiltrate or effusion is seen. Mediastinal and hilar
contours appear normal. The heart is within normal limits in size.
No bony abnormality is seen.
IMPRESSION: No active cardiopulmonary disease.

## 2015-02-21 ENCOUNTER — Encounter: Payer: BLUE CROSS/BLUE SHIELD | Admitting: Family Medicine

## 2015-03-26 ENCOUNTER — Other Ambulatory Visit: Payer: Self-pay | Admitting: Family Medicine

## 2015-03-26 DIAGNOSIS — R05 Cough: Secondary | ICD-10-CM

## 2015-03-26 DIAGNOSIS — R059 Cough, unspecified: Secondary | ICD-10-CM

## 2015-03-26 MED ORDER — BENZONATATE 100 MG PO CAPS: 100.0000 mg | ORAL_CAPSULE | Freq: Two times a day (BID) | ORAL | Status: DC | PRN

## 2015-03-26 NOTE — Telephone Encounter (Signed)
Patient needs follow-up if still has cough. Refill given. Please inform the patient.

## 2015-03-26 NOTE — Telephone Encounter (Signed)
Letter mailed to patient. Jazmin Hartsell,CMA  

## 2015-03-26 NOTE — Telephone Encounter (Signed)
Asking for refill on tessalon pearls, still has a cough, Pt goes to [facility]

## 2015-08-06 ENCOUNTER — Telehealth: Payer: Self-pay | Admitting: Family Medicine

## 2015-08-06 NOTE — Telephone Encounter (Signed)
Pt made appt. No message needed. Closing. Kathryn French, ASA

## 2015-08-29 ENCOUNTER — Ambulatory Visit: Payer: BLUE CROSS/BLUE SHIELD | Admitting: Family Medicine

## 2018-10-22 ENCOUNTER — Ambulatory Visit (INDEPENDENT_AMBULATORY_CARE_PROVIDER_SITE_OTHER): Payer: 59 | Admitting: Family Medicine

## 2018-10-22 ENCOUNTER — Ambulatory Visit (INDEPENDENT_AMBULATORY_CARE_PROVIDER_SITE_OTHER): Payer: 59

## 2018-10-22 ENCOUNTER — Ambulatory Visit: Payer: 59

## 2018-10-22 ENCOUNTER — Encounter: Payer: Self-pay | Admitting: Family Medicine

## 2018-10-22 VITALS — BP 115/79 | HR 90 | Temp 98.2°F | Ht 62.75 in | Wt 161.8 lb

## 2018-10-22 DIAGNOSIS — R05 Cough: Secondary | ICD-10-CM

## 2018-10-22 DIAGNOSIS — R6889 Other general symptoms and signs: Secondary | ICD-10-CM

## 2018-10-22 DIAGNOSIS — R059 Cough, unspecified: Secondary | ICD-10-CM

## 2018-10-22 DIAGNOSIS — J0100 Acute maxillary sinusitis, unspecified: Secondary | ICD-10-CM | POA: Diagnosis not present

## 2018-10-22 LAB — POC INFLUENZA A&B (BINAX/QUICKVUE)
Influenza A, POC: NEGATIVE
Influenza B, POC: NEGATIVE

## 2018-10-22 MED ORDER — AMOXICILLIN-POT CLAVULANATE 875-125 MG PO TABS: 1.0000 | ORAL_TABLET | Freq: Two times a day (BID) | ORAL | 0 refills | Status: DC

## 2018-10-22 MED ORDER — BENZONATATE 100 MG PO CAPS
100.0000 mg | ORAL_CAPSULE | Freq: Three times a day (TID) | ORAL | 0 refills | Status: DC | PRN
Start: 2018-10-22 — End: 2019-04-21

## 2018-10-22 NOTE — Progress Notes (Signed)
1/17/202012:29 PM  Kathryn French May 09, 1991, 28 y.o. female [id number]  Chief Complaint  Patient presents with  . Cough    coughing causing chest pain, had some fever and chills. Has been feeling this way for the past wk    HPI:   Patient is a 28 y.o. female who presents today for nasal congestion, coughing, chest pain  Got sick on 12/30, was getting better however a week ago started having productive cough, lots of nasal congestion and left sided facial pain, fever, chills, body aches, fever, night sweats Feels it is worsening Had flu vaccine this season Denies h/o asthma Doe not smoke Denies any SOB Has vomited a couple of times after cough Denies any abd pain or diarrhea   Fall Risk  06/08/2014  Falls in the past year? No     Depression screen [facility] 2/9 12/14/2014 06/08/2014  Decreased Interest 0 0  Down, Depressed, Hopeless 0 0  PHQ - 2 Score 0 0    No Known Allergies  Prior to Admission medications   Medication Sig Start Date End Date Taking? Authorizing Provider  azithromycin (ZITHROMAX Z-PAK) 250 MG tablet Take taper pack as directed by pharmacy 06/08/14   Felix Pacini A, DO  benzonatate (TESSALON) 100 MG capsule Take 1 capsule (100 mg total) by mouth 2 (two) times daily as needed for cough. 03/26/15   Glori Luis, MD  triamcinolone cream (KENALOG) 0.1 % Apply 1 application topically 2 (two) times daily. 12/11/14   Glori Luis, MD    No past medical history on file.  No past surgical history on file.  Social History   Tobacco Use  . Smoking status: Never Smoker  Substance Use Topics  . Alcohol use: Not on file    No family history on file.  ROS Per hpi  OBJECTIVE:  Blood pressure 115/79, pulse 90, temperature 98.2 F (36.8 C), temperature source Oral, height 5' 2.75" (1.594 m), weight 161 lb 12.8 oz (73.4 kg), SpO2 95 %. Body mass index is 28.89 kg/m.   Physical Exam Vitals signs and nursing note reviewed.  Constitutional:    Appearance: She is well-developed.  HENT:     Head: Normocephalic and atraumatic.     Right Ear: Hearing, tympanic membrane, ear canal and external ear normal.     Left Ear: Hearing, tympanic membrane, ear canal and external ear normal.     Nose:     Left Sinus: Maxillary sinus tenderness and frontal sinus tenderness present.  Eyes:     Conjunctiva/sclera: Conjunctivae normal.     Pupils: Pupils are equal, round, and reactive to light.  Neck:     Musculoskeletal: Neck supple.  Cardiovascular:     Rate and Rhythm: Normal rate and regular rhythm.     Heart sounds: Normal heart sounds. No murmur. No friction rub. No gallop.   Pulmonary:     Effort: Pulmonary effort is normal.     Breath sounds: Normal breath sounds. No wheezing or rales.  Lymphadenopathy:     Cervical: No cervical adenopathy.  Skin:    General: Skin is warm and dry.  Neurological:     Mental Status: She is alert and oriented to person, place, and time.      Results for orders placed or performed in visit on 10/22/18 (from the past 24 hour(s))  POC Influenza A&B(BINAX/QUICKVUE)     Status: None   Collection Time: 10/22/18 11:38 AM  Result Value Ref Range   Influenza A, POC  Negative Negative   Influenza B, POC Negative Negative    Dg Chest 2 View  Result Date: 10/22/2018 CLINICAL DATA:  Cough EXAM: CHEST - 2 VIEW COMPARISON:  06/09/2014 FINDINGS: Normal mediastinum and cardiac silhouette. Normal pulmonary vasculature. No evidence of effusion, infiltrate, or pneumothorax. No acute bony abnormality. IMPRESSION: No acute cardiopulmonary process. Electronically Signed   By: Genevive Bi M.D.   On: 10/22/2018 12:05     ASSESSMENT and PLAN  1. Acute non-recurrent maxillary sinusitis Discussed supportive measures, new meds r/se/b and RTC precautions. Patient educational handout given.  2. Flu-like symptoms - POC Influenza A&B(BINAX/QUICKVUE)  3. Cough - DG Chest 2 View; Future  Other orders -  amoxicillin-clavulanate (AUGMENTIN) 875-125 MG tablet; Take 1 tablet by mouth 2 (two) times daily. - benzonatate (TESSALON) 100 MG capsule; Take 1-2 capsules (100-200 mg total) by mouth 3 (three) times daily as needed for cough.   Return if symptoms worsen or fail to improve.    Myles Lipps, MD [facility] [address] [place], [place] [address] Ph.  [phone] Fax [phone]

## 2018-10-22 NOTE — Patient Instructions (Signed)

## 2018-11-29 ENCOUNTER — Ambulatory Visit: Payer: Self-pay | Admitting: Family Medicine

## 2018-12-21 ENCOUNTER — Encounter: Payer: Self-pay | Admitting: Family Medicine

## 2018-12-21 ENCOUNTER — Ambulatory Visit (INDEPENDENT_AMBULATORY_CARE_PROVIDER_SITE_OTHER): Payer: 59 | Admitting: Family Medicine

## 2018-12-21 ENCOUNTER — Other Ambulatory Visit: Payer: Self-pay

## 2018-12-21 VITALS — BP 110/60 | HR 70 | Temp 98.4°F | Ht 63.0 in | Wt 166.0 lb

## 2018-12-21 DIAGNOSIS — R5383 Other fatigue: Secondary | ICD-10-CM | POA: Diagnosis not present

## 2018-12-21 DIAGNOSIS — Z13220 Encounter for screening for lipoid disorders: Secondary | ICD-10-CM | POA: Diagnosis not present

## 2018-12-21 DIAGNOSIS — R05 Cough: Secondary | ICD-10-CM

## 2018-12-21 DIAGNOSIS — R059 Cough, unspecified: Secondary | ICD-10-CM

## 2018-12-21 DIAGNOSIS — Z114 Encounter for screening for human immunodeficiency virus [HIV]: Secondary | ICD-10-CM

## 2018-12-21 DIAGNOSIS — R635 Abnormal weight gain: Secondary | ICD-10-CM | POA: Insufficient documentation

## 2018-12-21 LAB — POCT GLYCOSYLATED HEMOGLOBIN (HGB A1C): Hemoglobin A1C: 5.7 % — AB (ref 4.0–5.6)

## 2018-12-21 MED ORDER — LORATADINE 10 MG PO TABS: 10.0000 mg | ORAL_TABLET | Freq: Every day | ORAL | 11 refills | Status: DC

## 2018-12-21 MED ORDER — FLUTICASONE PROPIONATE 50 MCG/ACT NA SUSP: 2.0000 | Freq: Every day | NASAL | 6 refills | Status: DC

## 2018-12-21 NOTE — Progress Notes (Addendum)
Phone: [phone]  Subjective:  CC --establish care as new patient; With complaints of weight gain and cough Pt reports: She has had weight gain of 20 pounds over the past 2 years.  Patient states that she started a vegetarian diet in December 2015 but was told that she did not have enough proteins that started adding back white meat.  States that she exercises 2-3 times per week and usually has a homemade smoothie for breakfast, salad for lunch and his typical [place] meal with rice vegetables for dinner.  Patient states that her weight gain has caused her much distress and she is concerned that something else may be going on.  Patient states she is also feeling fatigued on and off as well as having moments where she is feeling cold however body also feeling warm.  Patient does state that rice is a staple of her diet and she eats this nightly.  Chronic cough Patient reports that she developed a cough on 12/30 that carried on through the years at which point it got a lot worse.  Patient was seen at [facility] in January and was diagnosed with sinusitis.  Patient had chest x-ray done at that time which was normal.  States that her cough is nonproductive.  It has been getting better since she took antibiotics and January.  Patient reports that she has been trying [employer], honey and cough drops without much success.  States that her cough is worse during the day, it is better at night.  Does not have any association with what she is eating.  States that she has a history of seasonal allergies and was previously on Zyrtec and Flonase which she has not taken in the past 2 years.   Cardiovascular: - Dx Hypertension: no  - Dx Hyperlipidemia: no, patient with family history of hyperlipidemia in mother and father and has never been tested.  Would like to have lipid panel checked today given that she is also having weight gain. - Physical Activity: yes, goes to the gym 2-3 times per week, and does Zumba 2  times per week - Diabetes: Patient with mother and father who both have diabetes, patient reports that she has been experiencing polydipsia and polyuria and was concerned that she may have diabetes.  Cancer: Lung >> Tobacco Use: no  Cervical/Endometrial >>  - Postmenopausal: no  - Vaginal Bleeding: no - Pap Smear: Patient has never had a Pap smear  Social: Alcohol Use: no  Tobacco Use: no  Other Drugs: no  Risky Sexual Behavior: Patient has never been sexually active Depression: Has never been diagnosed that patient is concerned   Office Visit from 12/21/2018 in [facility]  PHQ-9 Total Score  8     Support and Life at Home: yes   Review of Systems -  Review of Systems  Constitutional: Negative for fever and weight loss.  Respiratory: Positive for cough. Negative for sputum production, shortness of breath and wheezing.   Cardiovascular: Negative for chest pain and orthopnea.  Gastrointestinal: Negative for abdominal pain, blood in stool, constipation, diarrhea, heartburn, nausea and vomiting.  Genitourinary: Positive for frequency.  Neurological: Positive for headaches. Negative for dizziness.  Endo/Heme/Allergies: Positive for polydipsia.  All other systems reviewed and are negative.   Past Medical History Patient Active Problem List   Diagnosis Date Noted  . Weight gain 12/21/2018  . Fatigue 12/21/2018  . Cough 06/08/2014  . RHINITIS, ALLERGIC 12/03/2006    Medications- reviewed and updated  Current Outpatient Medications  Medication Sig Dispense Refill  . amoxicillin-clavulanate (AUGMENTIN) 875-125 MG tablet Take 1 tablet by mouth 2 (two) times daily. 14 tablet 0  . azithromycin (ZITHROMAX Z-PAK) 250 MG tablet Take taper pack as directed by pharmacy 6 each 0  . benzonatate (TESSALON) 100 MG capsule Take 1 capsule (100 mg total) by mouth 2 (two) times daily as needed for cough. 20 capsule 0  . benzonatate (TESSALON) 100 MG capsule Take 1-2  capsules (100-200 mg total) by mouth 3 (three) times daily as needed for cough. 40 capsule 0  . fluticasone (FLONASE) 50 MCG/ACT nasal spray Place 2 sprays into both nostrils daily. 16 g 6  . loratadine (CLARITIN) 10 MG tablet Take 1 tablet (10 mg total) by mouth daily. 30 tablet 11  . triamcinolone cream (KENALOG) 0.1 % Apply 1 application topically 2 (two) times daily. 30 g 0   No current facility-administered medications for this visit.     Objective: BP 110/60   Pulse 70   Temp 98.4 F (36.9 C) (Oral)   Ht  (1.6 m)   Wt 166 lb (75.3 kg)   LMP 12/15/2018   BMI 29.41 kg/m  Gen: NAD, alert, cooperative with exam HEENT: NCAT, EOMI, PERRL CV: RRR, good S1/S2, no murmur Resp: CTABL, no wheezes, non-labored Abd: Soft, Non Tender, Non Distended Ext: No edema, warm Neuro: Alert and oriented, No gross deficits Psych: normal affect, normal speech   Assessment/Plan:  Cough Patient with continued cough.  With normal CXR on 10/22/2018 with no signs of pneumonia.  Exam not concerning for pneumonia and patient reports that her cough is improving.  Patient has history of allergic rhinitis and has not been taking any medication for this but states that she has had cough from this before.  Patient denies any symptoms of GERD.  Will start patient on Zyrtec and Flonase to see if this improves her cough and she is to return if she does not have resolution of her symptoms.  Weight gain Patient encouraged to continue exercising regularly as well as eating plenty of vegetables in her diet.  Patient encouraged to decrease the amount of rice that she is eating and states that it is only about a half a cup. -Based on patient history of also feeling fatigued with cold intolerance will check TSH to rule out thyroid as cause of her unintentional weight gain. -PHQ 9 with a score of 8 which could also be adding to her unintentional weight gain. Will reevaluate at follow-up visit to follow along with  repeat PHQ 9. -Will also check lipid panel given patient's family history as well as weight gain and never previously being screened  Fatigue Given patient's fatigue will check BMP and TSH.  Patient also due for routine screening of HIV, however she is low risk. Will also screen patient today for diabetes given strong family history as well as patient endorsing polyuria and polydipsia.   Orders Placed This Encounter  Procedures  . Lipid panel  . TSH  . HIV Antibody (routine testing w rflx)  . Basic metabolic panel  . POCT glycosylated hemoglobin (Hb A1C)    Meds ordered this encounter  Medications  . fluticasone (FLONASE) 50 MCG/ACT nasal spray    Sig: Place 2 sprays into both nostrils daily.    Dispense:  16 g    Refill:  6  . loratadine (CLARITIN) 10 MG tablet    Sig: Take 1 tablet (10 mg total) by mouth daily.  Dispense:  30 tablet    Refill:  11    [place] Markea Ruzich, DO Family Medicine Resident PGY-2

## 2018-12-21 NOTE — Patient Instructions (Signed)
Thank you for coming to see me today. It was a pleasure!   We will call you with abnormal lab results.   Please follow-up with me as needed.  If you have any questions or concerns, please do not hesitate to call the office at [phone].  Take Care,   [place] Joylene Wescott, DO

## 2018-12-21 NOTE — Assessment & Plan Note (Addendum)
Patient encouraged to continue exercising regularly as well as eating plenty of vegetables in her diet.  Patient encouraged to decrease the amount of rice that she is eating and states that it is only about a half a cup. -Based on patient history of also feeling fatigued with cold intolerance will check TSH to rule out thyroid as cause of her unintentional weight gain. -PHQ 9 with a score of 8 which could also be adding to her unintentional weight gain. Will reevaluate at follow-up visit to follow along with repeat PHQ 9. -Will also check lipid panel given patient's family history as well as weight gain and never previously being screened

## 2018-12-21 NOTE — Assessment & Plan Note (Signed)
Patient with continued cough.  With normal CXR on 10/22/2018 with no signs of pneumonia.  Exam not concerning for pneumonia and patient reports that her cough is improving.  Patient has history of allergic rhinitis and has not been taking any medication for this but states that she has had cough from this before.  Patient denies any symptoms of GERD.  Will start patient on Zyrtec and Flonase to see if this improves her cough and she is to return if she does not have resolution of her symptoms.

## 2018-12-21 NOTE — Assessment & Plan Note (Addendum)
Given patient's fatigue will check BMP and TSH.  Patient also due for routine screening of HIV, however she is low risk. Will also screen patient today for diabetes given strong family history as well as patient endorsing polyuria and polydipsia.

## 2018-12-22 LAB — LIPID PANEL
CHOL/HDL RATIO: 4.5 ratio — AB (ref 0.0–4.4)
Cholesterol, Total: 197 mg/dL (ref 100–199)
HDL: 44 mg/dL (ref >39)
TRIGLYCERIDES: 413 mg/dL — AB (ref 0–149)

## 2018-12-22 LAB — TSH: TSH: 1.69 u[IU]/mL (ref 0.450–4.500)

## 2018-12-22 LAB — BASIC METABOLIC PANEL
BUN/Creatinine Ratio: 20 (ref 9–23)
BUN: 11 mg/dL (ref 6–20)
CO2: 25 mmol/L (ref 20–29)
Calcium: 9.2 mg/dL (ref 8.7–10.2)
Chloride: 104 mmol/L (ref 96–106)
Creatinine, Ser: 0.54 mg/dL — ABNORMAL LOW (ref 0.57–1.00)
GFR calc Af Amer: 149 mL/min/{1.73_m2} (ref >59)
GFR calc non Af Amer: 129 mL/min/{1.73_m2} (ref >59)
GLUCOSE: 108 mg/dL — AB (ref 65–99)
Potassium: 4.6 mmol/L (ref 3.5–5.2)
Sodium: 142 mmol/L (ref 134–144)

## 2018-12-22 LAB — HIV ANTIBODY (ROUTINE TESTING W REFLEX): HIV Screen 4th Generation wRfx: NONREACTIVE

## 2018-12-23 ENCOUNTER — Encounter: Payer: Self-pay | Admitting: Family Medicine

## 2018-12-23 DIAGNOSIS — R7303 Prediabetes: Secondary | ICD-10-CM | POA: Insufficient documentation

## 2018-12-23 HISTORY — DX: Prediabetes: R73.03

## 2019-04-21 ENCOUNTER — Ambulatory Visit (INDEPENDENT_AMBULATORY_CARE_PROVIDER_SITE_OTHER): Payer: 59 | Admitting: Family Medicine

## 2019-04-21 ENCOUNTER — Other Ambulatory Visit: Payer: Self-pay

## 2019-04-21 VITALS — BP 114/60 | HR 73 | Ht 63.0 in | Wt 163.2 lb

## 2019-04-21 DIAGNOSIS — R21 Rash and other nonspecific skin eruption: Secondary | ICD-10-CM

## 2019-04-21 DIAGNOSIS — Z3046 Encounter for surveillance of implantable subdermal contraceptive: Secondary | ICD-10-CM

## 2019-04-21 DIAGNOSIS — Z30019 Encounter for initial prescription of contraceptives, unspecified: Secondary | ICD-10-CM

## 2019-04-21 DIAGNOSIS — Z30017 Encounter for initial prescription of implantable subdermal contraceptive: Secondary | ICD-10-CM

## 2019-04-21 DIAGNOSIS — Z3009 Encounter for other general counseling and advice on contraception: Secondary | ICD-10-CM

## 2019-04-21 LAB — POCT URINE PREGNANCY: Preg Test, Ur: NEGATIVE

## 2019-04-21 MED ORDER — HYDROCORTISONE 2.5 % EX OINT: TOPICAL_OINTMENT | Freq: Two times a day (BID) | CUTANEOUS | 3 refills | Status: DC

## 2019-04-21 NOTE — Progress Notes (Signed)
Subjective:  Patient ID: Kathryn French  DOB: 1991/01/24 MRN: [id number]  Kathryn French is a 28 y.o. female with a PMH of allergies, here today for rash.   HPI:  Rash: -Patient reports that the rash started 2 days ago and is present on her legs, arms, back and buttocks.  She has been using an anti-itch cream that has helped a little bit.  She states that she had this years before and at that time had an allergy test. -She reports no new medications, no fevers, no chills. -She does think that she has had a recent change in body wash.  She started using a new Shea butter with honey and milk about 1-1/2 weeks ago.  She has been taking her Claritin regularly. - She has previously seen an allergist who states that she is allergic to trees, pollen, spiders and cockroaches.   Desire for Nexplanon contraception -She reports that she has previously used the patch and pills for her irregular periods and cramping.  She states that she was never very good at taking the pills and the patch caused a rash.  She has looked at many options over the past few months and would like to have a Nexplanon placed. -Discussed the risks and benefits with patient of the Nexplanon. -She states that she would still like to have it placed today. -Patient is a non-smoker with no history of blood clots.  Her blood pressure has been normal at her last 2 visits.  ROS: as mentioned in HPI   Social hx: Denies use of illicit drugs, alcohol use Smoking status reviewed  Patient Active Problem List   Diagnosis Date Noted  . Prediabetes 12/23/2018  . Weight gain 12/21/2018  . Fatigue 12/21/2018  . Rash 12/14/2013  . RHINITIS, ALLERGIC 12/03/2006     Objective:  BP 114/60   Pulse 73   Ht 5\' 3"  (1.6 m)   Wt 163 lb 4 oz (74 kg)   LMP 04/20/2019   SpO2 98%   BMI 28.92 kg/m   Vitals and nursing note reviewed  General: NAD, pleasant, well-appearing Pulm: normal effort Extremities: no edema or cyanosis. WWP.  Skin: warm and dry, wheels along upper thigh with no noted excoriation with some also noted on forarms b/l.  Neuro: alert and oriented, no focal deficits Psych: normal affect, normal thought content  Assessment & Plan:   Rash Patient with rash that appears allergic versus eczematous in nature.  She has tried Gold Bond anti-itch cream which has helped with the itching.  She has been using her Claritin daily.  She has no red flag symptoms.  Given prescription for hydrocortisone cream to apply twice daily until improved.  If it is not better in the next couple of weeks she is to follow-up.  She may also message me on my chart in order to have a referral to an allergist.   Discussed return precautions, patient with understanding.  PROCEDURE NOTE: [place] Patient given informed consent and signed copy in the chart.  Pregnancy test was  negative  Appropriate time out was taken. Left  arm was prepped and draped in the usual sterile fashion. Appropriate measurement was made for insertion of nexplanon and landmarks identified, insertion site marked. Two cc of 1%lidocaine  was used for local anesthesia. Once anesthesia obtained, nexplanon was inserted in typical fashion. No complications. Pressure bandage applied to decrease bruising. Patient given follow up instructions should she experience redness, swelling at sight or fever in the next  24 hours. Patient given Nexplanon pocket card.  [place] Ryelynn Guedea, DO Family Medicine Resident PGY-3

## 2019-04-21 NOTE — Assessment & Plan Note (Addendum)
Patient with rash that appears allergic versus eczematous in nature.  She has tried Gold Bond anti-itch cream which has helped with the itching.  She has been using her Claritin daily.  She has no red flag symptoms.  Given prescription for hydrocortisone cream to apply twice daily until improved.  If it is not better in the next couple of weeks she is to follow-up.  She may also message me on my chart in order to have a referral to an allergist.   Discussed return precautions, patient with understanding.

## 2019-04-21 NOTE — Patient Instructions (Addendum)
Thank you for coming to see me today. It was a pleasure! Today we talked about:   For your rash I would avoid the new body wash that you has started using.  I would continue to use your Claritin as well as the Goldbond cream.  You may also try using the hydrocortisone cream that I provided in addition to the Goldbond cream.  If this rash does not improve or worsens then you may come back to the office or message me if you would like to have the allergy consult as we discussed.  Please follow-up with me for your Pap smear or sooner as needed.  If you have any questions or concerns, please do not hesitate to call the office at [phone].  Take Care,   [place] Leaming, DO   Contraceptive Implant Information A contraceptive implant is a small, plastic rod that is inserted under the skin. The implant releases a hormone into the bloodstream that prevents pregnancy. Contraceptive implants can be effective for up to 3 years. They do not provide protection against STIs (sexually transmitted infections). How does the implant work? Contraceptive implants prevent pregnancy by releasing a small amount of progestin into the bloodstream. Progestin has similar effects to the hormone progesterone, which plays a role in menstrual periods and pregnancy. Progestin will:  Stop the ovaries from releasing eggs.  Thicken cervical mucus to prevent sperm from entering the cervix.  Thin out the lining of the uterus to prevent a fertilized egg from attaching to the wall of the uterus. What are the advantages of this form of birth control? The advantages of this form of birth control include the following:  It is very effective at preventing pregnancy.  It is effective for up to 3 years.  It can easily be removed.  It does not interfere with sex or daily activities.  It can be used when breastfeeding.  It can be used by women who cannot take estrogen.  The procedure to insert the device is quick.   Women can get pregnant shortly after removing the device. What are the disadvantages of this form of birth control? The disadvantages of this form of birth control include the following:  It can cause side effects, including: ? Irregular menstrual periods or bleeding. ? Headache. ? Weight gain. ? Acne. ? Breast tenderness. ? Abdomen (abdominal) pain. ? Mood changes, such as depression.  It does not protect against STIs.  You must make an office visit to have it inserted and removed by a trained clinician.  Inserting or removing the device can result in pain, scarring, and tissue or nerve damage (rare). How is this implant inserted? The procedure to insert an implant only takes a few minutes. During the procedure:  Your upper arm will be numbed with a numbing medicine (local anesthetic).  The implant will be injected under the skin of your upper arm with a needle. After the procedure:  You may experience minor bruising, swelling, or discomfort at the insertion site. This should only last for a couple of days.  You may need to use another, non-hormonal contraceptive such as a condom for 7 days after the procedure. How is the implant removed? The implant should be removed after 3 years or as directed by your health care provider. The procedure to remove the implant only takes a few minutes. During this procedure:  Your upper arm will be numbed with a local anesthetic.  A small incision will be made near the implant.  The implant will be removed with a small pair of forceps. After the implant is removed:  The effect of the implant will wear off a few hours after removal. Most women will be able to get pregnant within 3 weeks of removal.  A new implant can be inserted as soon as the old one is removed, if desired.  You may experience minor bruising, swelling, or discomfort at the removal site. This should only last for a couple of days. Is this implant right for me? Your  health care provider can help you determine whether you are good candidate for a contraceptive implant. Make sure to discuss the possible side effects with your health care provider. You should not get the implant if you:  Are pregnant.  Are allergic to any part of the implant.  Have a history of: ? Breast cancer. ? Unusual bleeding from the vagina. ? Heart disease. ? Stroke. ? Liver disease or tumors. ? Migraines. Summary  A contraceptive implant is a small, plastic rod that is inserted under the skin. The implant releases a hormone into the bloodstream that prevents pregnancy.  Contraceptive implants can be effective for up to 3 years.  The implant works by preventing ovaries from releasing eggs, thickening the cervical mucus, and thinning the uterine wall.  This form of birth control is very effective at preventing pregnancy and can be inserted and removed quickly. Women can get pregnant shortly after the device is removed.  This form of birth control can cause some side effects, including weight gain, breast tenderness, headaches, irregular periods or bleeding, acne, abdominal pain, and depression. It does not provide protection against STIs (sexually transmitted infections). This information is not intended to replace advice given to you by your health care provider. Make sure you discuss any questions you have with your health care provider. Document Released: 09/11/2011 Document Revised: 11/16/2018 Document Reviewed: 09/06/2016 Elsevier Patient Education  2020 [employer].

## 2019-04-22 MED ORDER — ETONOGESTREL 68 MG ~~LOC~~ IMPL
68.0000 mg | DRUG_IMPLANT | Freq: Once | SUBCUTANEOUS | Status: AC
  Administered 2019-04-21: 68 mg via SUBCUTANEOUS

## 2019-04-22 NOTE — Addendum Note (Signed)
Addended by: Christen Bame D on: 04/22/2019 10:27 AM   Modules accepted: Orders

## 2019-05-18 ENCOUNTER — Encounter: Payer: Self-pay | Admitting: Family Medicine

## 2019-07-20 ENCOUNTER — Encounter: Payer: Self-pay | Admitting: Family Medicine

## 2019-07-20 ENCOUNTER — Other Ambulatory Visit: Payer: Self-pay | Admitting: Family Medicine

## 2019-07-20 MED ORDER — BENZONATATE 100 MG PO CAPS: 100.0000 mg | ORAL_CAPSULE | Freq: Two times a day (BID) | ORAL | 0 refills | Status: DC | PRN

## 2019-10-02 ENCOUNTER — Encounter: Payer: Self-pay | Admitting: Family Medicine

## 2019-10-03 ENCOUNTER — Ambulatory Visit: Payer: 59 | Attending: Internal Medicine

## 2019-10-04 ENCOUNTER — Encounter: Payer: Self-pay | Admitting: Family Medicine

## 2019-10-05 ENCOUNTER — Encounter: Payer: Self-pay | Admitting: Family Medicine

## 2019-10-10 ENCOUNTER — Ambulatory Visit: Payer: 59 | Attending: Internal Medicine

## 2019-10-11 ENCOUNTER — Telehealth (INDEPENDENT_AMBULATORY_CARE_PROVIDER_SITE_OTHER): Payer: 59 | Admitting: Family Medicine

## 2019-10-11 ENCOUNTER — Encounter: Payer: Self-pay | Admitting: Family Medicine

## 2019-10-11 ENCOUNTER — Other Ambulatory Visit: Payer: Self-pay

## 2019-10-11 DIAGNOSIS — R05 Cough: Secondary | ICD-10-CM

## 2019-10-11 DIAGNOSIS — Z20822 Contact with and (suspected) exposure to covid-19: Secondary | ICD-10-CM

## 2019-10-11 DIAGNOSIS — R058 Other specified cough: Secondary | ICD-10-CM

## 2019-10-11 MED ORDER — BENZONATATE 100 MG PO CAPS: 100.0000 mg | ORAL_CAPSULE | Freq: Two times a day (BID) | ORAL | 0 refills | Status: DC | PRN

## 2019-10-11 NOTE — Progress Notes (Signed)
[facility] Telemedicine Visit  Patient consented to have virtual visit. Method of visit: Video was attempted, but technology challenges prevented patient from using video, so visit was conducted via telephone.  Encounter participants: Patient: Kathryn French - located in back seat of car Provider: Nicki Guadalajara - located at provider's home  Others (if applicable): none   Chief Complaint: cough fromCOVID-19  HPI:  Kathryn French reports that she has been dealing with a persistent cough. She has tried both night quil and day quikl with no improvement. She denies hemoptysis and states that her cough is dry. The patient and both of her parents have the same symptoms and that they have all  tested positive for the virus since 10/01/19. Patient has been quarantined at home.   Patient states that she does not have much of an appetite.  Been drinking pedialyte and gatorade, she reports she has been keeping up with hydration well. Patient reports that she has lost sense of smell and taste. She previously had muscle aches and fever but has not had a fever recently. Patient is inquiring if she will need another test prior to returning to work. I informed of the CDC recommendations to not re-test for another 90 days and referred the patient to the [facility] website as a reference.   ROS: per HPI  Pertinent PMHx: no significant past medical history   Exam:  General: patient appears mildly ill but non-toxic Respiratory: intermittently coughing during exam, speaking in full sentences, no signs of respiratory distress, no retractions visualized via video  Assessment/Plan:  Cough with exposure to COVID-19 virus Cough is likely due ot infection with COVID-19 virus. Patient could also be experiencing cough due to allergies or post nasal drip from sinus inflammation.  -prescribed tessalon perles  -encouraged adequate hydration, warm teas with honey and lemon, steam inhalation and  continue robitussin     Time spent during visit with patient: 17 minutes

## 2019-10-13 ENCOUNTER — Ambulatory Visit: Payer: 59 | Attending: Internal Medicine

## 2019-10-14 ENCOUNTER — Encounter: Payer: Self-pay | Admitting: Family Medicine

## 2019-10-14 DIAGNOSIS — R058 Other specified cough: Secondary | ICD-10-CM | POA: Insufficient documentation

## 2019-10-14 NOTE — Assessment & Plan Note (Signed)
Cough is likely due ot infection with COVID-19 virus. Patient could also be experiencing cough due to allergies or post nasal drip from sinus inflammation.  -prescribed tessalon perles  -encouraged adequate hydration, warm teas with honey and lemon, steam inhalation and continue robitussin

## 2019-11-09 ENCOUNTER — Encounter: Payer: Self-pay | Admitting: Family Medicine

## 2019-12-16 ENCOUNTER — Ambulatory Visit: Payer: 59 | Attending: Internal Medicine

## 2019-12-30 ENCOUNTER — Encounter: Payer: Self-pay | Admitting: Family Medicine

## 2020-01-02 ENCOUNTER — Ambulatory Visit: Payer: 59 | Admitting: Family Medicine

## 2020-01-05 ENCOUNTER — Ambulatory Visit: Payer: 59 | Admitting: Family Medicine

## 2020-01-12 ENCOUNTER — Encounter: Payer: Self-pay | Admitting: Family Medicine

## 2020-01-13 ENCOUNTER — Encounter: Payer: Self-pay | Admitting: Family Medicine

## 2020-01-19 ENCOUNTER — Ambulatory Visit: Payer: 59 | Admitting: Family Medicine

## 2020-01-30 ENCOUNTER — Encounter: Payer: Self-pay | Admitting: Family Medicine

## 2020-02-13 NOTE — Progress Notes (Deleted)
   Subjective:   Patient ID: Kathryn French    DOB: 08-06-1991, 29 y.o. female   MRN: [id number]  Chevella Haltiwanger is a 29 y.o. female here for UTI symptoms.  UTI symptom: Patient presenting today for UTI symptoms including *** x ***. She notes  Denies vaginal discharge or hematuria. Sexually active with *** *** partners. Contraception*** Desires/Does not desire STD testing.  Health Maintenance: Due for Pap-smear  Review of Systems:  Per HPI.   Objective:   There were no vitals taken for this visit. Vitals and nursing note reviewed.  General: well nourished, well developed, in no acute distress with non-toxic appearance HEENT: normocephalic, atraumatic, moist mucous membranes Neck: supple, non-tender without lymphadenopathy CV: regular rate and rhythm without murmurs, rubs, or gallops, no lower extremity edema Lungs: clear to auscultation bilaterally with normal work of breathing Abdomen: soft, non-tender, non-distended, no masses or organomegaly palpable, normoactive bowel sounds Skin: warm, dry, no rashes or lesions Extremities: warm and well perfused, normal tone MSK: ROM grossly intact, strength intact, gait normal Neuro: Alert and oriented, speech normal  Assessment & Plan:   No problem-specific Assessment & Plan notes found for this encounter.  No orders of the defined types were placed in this encounter.  No orders of the defined types were placed in this encounter.   Orpah Cobb, DO PGY-2, [facility] 02/13/2020 12:52 PM

## 2020-02-14 ENCOUNTER — Ambulatory Visit: Payer: 59

## 2020-05-07 ENCOUNTER — Encounter: Payer: Self-pay | Admitting: Family Medicine

## 2020-05-10 ENCOUNTER — Other Ambulatory Visit: Payer: Self-pay

## 2020-05-10 ENCOUNTER — Encounter: Payer: Self-pay | Admitting: Family Medicine

## 2020-05-10 ENCOUNTER — Ambulatory Visit (INDEPENDENT_AMBULATORY_CARE_PROVIDER_SITE_OTHER): Payer: 59 | Admitting: Family Medicine

## 2020-05-10 VITALS — BP 100/60 | HR 78 | Ht 63.0 in | Wt 178.2 lb

## 2020-05-10 DIAGNOSIS — L739 Follicular disorder, unspecified: Secondary | ICD-10-CM | POA: Insufficient documentation

## 2020-05-10 DIAGNOSIS — Z1159 Encounter for screening for other viral diseases: Secondary | ICD-10-CM | POA: Insufficient documentation

## 2020-05-10 DIAGNOSIS — R7303 Prediabetes: Secondary | ICD-10-CM | POA: Diagnosis not present

## 2020-05-10 DIAGNOSIS — R635 Abnormal weight gain: Secondary | ICD-10-CM | POA: Diagnosis not present

## 2020-05-10 HISTORY — DX: Follicular disorder, unspecified: L73.9

## 2020-05-10 HISTORY — DX: Encounter for screening for other viral diseases: Z11.59

## 2020-05-10 LAB — POCT GLYCOSYLATED HEMOGLOBIN (HGB A1C): HbA1c, POC (prediabetic range): 6.4 % (ref 5.7–6.4)

## 2020-05-10 NOTE — Assessment & Plan Note (Addendum)
-  A1c today 6.4, waiting BMP with blood glucose -encouraged to continue healthy eating and exercising habits  -Follow up in 1 month to discuss dietary habits in more detail -will consider referring to nutritionist, per patient preference. Denies nutrition referral at this point, would like to have another discussion with me prior to nutritionist evaulation

## 2020-05-10 NOTE — Assessment & Plan Note (Signed)
-  Hep C screening conducted today, pending results. Will notify patient when results arrive.

## 2020-05-10 NOTE — Assessment & Plan Note (Signed)
-  Apply aloe vera multiple times a day as needed to affected area -Encouraged patient to shave along the grain of hair growth -Continue to monitor

## 2020-05-10 NOTE — Patient Instructions (Addendum)
It was so great meeting you today!  Today we discussed that the most likely cause of your leg itching is folliculitis which is irritation of the hair follicles. Try shaving in the way that the hair grows and applying aloe vera multiple times a day.   Please continue to exercise and eat a healthy diet. Meal portioning helps as we discussed. We also discussed many ways to be more active during the work day, including using a yoga ball and standing desk.   Since you were in the prediabetic range,  A1c is 6.4 and blood glucose to determine your sugar levels. Please continue to exercise and eat plenty of vegetables. Congratulations on already making a lot of those changes. I would like to go over your diet in detail and as we discussed will refer to nutritionist after our next visit.   We will see you back in 1 month to discuss diet in more detail, please contact us with any concerns or questions. We would be happy to see you earlier if anything arises. Thank you for allowing me to a part of your medical care!

## 2020-05-10 NOTE — Progress Notes (Signed)
    SUBJECTIVE:   CHIEF COMPLAINT / HPI:   Leg pruritus bilaterally  Patient presents to clinic with her thighs itching bilaterally for the past 2 weeks but states that it has gotten worse in the past few days. Denies this ever occurring before. Has tried cetaphil and hydrocortisone but those failed to provide full resolution. Denies using any new laundry detergents, deodorants, perfumes and any new foods.  Weight gain Endorses gaining 18 pounds over the course of more than a year. States that she has been eating healthy and joined a gym where she exercises twice a week. When asked, patient states that she feels healthy and like she has more energy. States that her job requires her to sit for long hours, she tries to take walks and get up to use the restroom frequently but wants to know any other way that she can implement more activity during her work day.   PERTINENT  PMH / PSH:   Prediabetes Last A1c 5.7 and blood glucose on 3/17/202. Denies any dizziness or hypoglycemic episodes. States that her postprandial blood glucose levels have been in ythe 130s-140s over the past few weeks. Mother has a history of diabetes for 30+ years and is concerned for her sugar levels today.  OBJECTIVE:   BP 100/60   Pulse 78   Ht 5\' 3"  (1.6 m)   Wt 178 lb 4 oz (80.9 kg)   SpO2 99%   BMI 31.58 kg/m   General: Patient well-appearing, in no acute distress.  Cardio: regular rate and rhythm, no murmurs or gallops noted Resp: lungs clear to auscultation bilaterally, no rales or rhonchi appreciated  Abdomen: nontender, active bowel sounds present Ext: distal pulses intact bilaterally, no LE edema noted bilaterally, tiny papules and mild erythema rash on dorsal aspect of thighs bilaterally    ASSESSMENT/PLAN:   Folliculitis -Apply aloe vera multiple times a day as needed to affected area -Encouraged patient to shave along the grain of hair growth -Continue to monitor   Weight gain -Encouraged patient  to continue to eat healthy and try to exercise more than twice weekly when time and work allows. -pending TSH, will notify patient when results arrive -Encouraged patient on getting a standing desk or using a yoga ball to allow for more activity at work, as well as taking walks during lunch time. Also discussed portion control.  -Will follow up in 1 month to discuss diet habits more in detail  Prediabetes -A1c today 6.4, waiting BMP with blood glucose -encouraged to continue healthy eating and exercising habits  -Follow up in 1 month to discuss dietary habits in more detail -will consider referring to nutritionist, per patient preference. Denies nutrition referral at this point, would like to have another discussion with me prior to nutritionist evaulation   Encounter for hepatitis C screening test for low risk patient -Hep C screening conducted today, pending results. Will notify patient when results arrive.      , DO [facility]

## 2020-05-10 NOTE — Assessment & Plan Note (Signed)
-  Encouraged patient to continue to eat healthy and try to exercise more than twice weekly when time and work allows. -pending TSH, will notify patient when results arrive -Encouraged patient on getting a standing desk or using a yoga ball to allow for more activity at work, as well as taking walks during lunch time. Also discussed portion control.  -Will follow up in 1 month to discuss diet habits more in detail

## 2020-05-11 LAB — BASIC METABOLIC PANEL
BUN/Creatinine Ratio: 19 (ref 9–23)
BUN: 12 mg/dL (ref 6–20)
CO2: 22 mmol/L (ref 20–29)
Calcium: 9.4 mg/dL (ref 8.7–10.2)
Chloride: 102 mmol/L (ref 96–106)
Creatinine, Ser: 0.64 mg/dL (ref 0.57–1.00)
GFR calc Af Amer: 138 mL/min/{1.73_m2} (ref >59)
GFR calc non Af Amer: 120 mL/min/{1.73_m2} (ref >59)
Glucose: 109 mg/dL — ABNORMAL HIGH (ref 65–99)
Potassium: 4.8 mmol/L (ref 3.5–5.2)
Sodium: 138 mmol/L (ref 134–144)

## 2020-05-11 LAB — HEPATITIS C ANTIBODY: Hep C Virus Ab: <0.1 s/co ratio (ref 0.0–0.9)

## 2020-05-11 LAB — TSH: TSH: 2.09 u[IU]/mL (ref 0.450–4.500)

## 2020-06-12 ENCOUNTER — Encounter: Payer: Self-pay | Admitting: Family Medicine

## 2020-06-12 ENCOUNTER — Ambulatory Visit: Payer: 59 | Admitting: Family Medicine

## 2020-06-12 ENCOUNTER — Other Ambulatory Visit: Payer: Self-pay

## 2020-06-12 VITALS — BP 108/68 | HR 78 | Ht 63.0 in | Wt 180.0 lb

## 2020-06-12 DIAGNOSIS — N926 Irregular menstruation, unspecified: Secondary | ICD-10-CM | POA: Insufficient documentation

## 2020-06-12 DIAGNOSIS — R7303 Prediabetes: Secondary | ICD-10-CM

## 2020-06-12 DIAGNOSIS — Z0000 Encounter for general adult medical examination without abnormal findings: Secondary | ICD-10-CM

## 2020-06-12 DIAGNOSIS — Z23 Encounter for immunization: Secondary | ICD-10-CM

## 2020-06-12 HISTORY — DX: Irregular menstruation, unspecified: N92.6

## 2020-06-12 NOTE — Assessment & Plan Note (Signed)
-  Received influenza vaccine today, no complications noted. Denies having complications in the past.  -Due for PAP smear, follow up in 2-3 months for this

## 2020-06-12 NOTE — Assessment & Plan Note (Signed)
-  Provided reassurance to patient, counseled that this is a common side effect of nexplanon.  -Follow up in 2-3 months, PAP smear at that time

## 2020-06-12 NOTE — Progress Notes (Signed)
    SUBJECTIVE:   CHIEF COMPLAINT / HPI:   Patient here for prediabetes follow up to discuss diet.   Irregular menstrual bleeding Patient complains of irregular bleeding. She had nexplanon placed for the first time in July 2020, never had any bleeding until a year later in July 2021. States that she had 2 weeks of spotting that she describes as brown discharge. Denies any clots. Wears pads and has to change then about every few hours. Patient had another episode of this during this month of September that lasts about 5 days. Denies any cramping or abdominal pain. 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 Reports having a strong support system of her mom and friends, endorses that people at work are very supportive as well.  PERTINENT  PMH / PSH:   Prediabetes Last A1c 6.4. States that she has starting going to the gym 3x/week and walks at work during lunchtime. Typically gets about 7 hours of sleep, gets up at 7:30 am when she drinks a 16 oz coffee with splenda. Eats breakfast around 10 am which usually consists of eggs and [place] sausage. Lunch is around 1:30-2:30pm consists of ordering takeout which is a salad or eats lentil soup and rice. Does not typically snack during the day but if she does she eats a variety of fruits throughout the day including cherries, oranges or finishes up her salad from lunch. Salad usually consists of vinaigrette with lettuce, tomatoes, cucumbers, chicken or [place]. Eats dinner around 10pm that is usually lentil soup, rice and fish. Endorses that she has been closely monitoring her carb intake, only eats 1/2 cup rice. Denies any issues with falling or staying asleep.   OBJECTIVE:   BP 108/68   Pulse 78   Ht 5\' 3"  (1.6 m)   Wt 180 lb (81.6 kg)   LMP 06/07/2020 (Approximate)   SpO2 97%   BMI 31.89 kg/m   General: Patient very pleasant, well-appearing, in no acute distress. HEENT:  normocephalic, supple neck, nontender thyroid, no lymphadenopathy  CV: RRR, no murmurs or gallops appreciated Resp: lungs clear to auscultation bilaterally, no rhonchi or rales noted Abdomen: soft, nontender, nondistended, presence of active bowel sounds Derm: skin warm and dry to touch, no rashes or lesions noted Ext: radial and distal pulses strong and equal bilaterally, no LE edema noted bilaterally Psych: mood appropriate   ASSESSMENT/PLAN:   Prediabetes -recheck A1c in 2 months, consider BMP glucose check at that time -referral to nutritionist placed, patient instructed to call Dr. 08/07/2020 next month to schedule appointment -Counseled on continuing lifestyle modifications, meal prepping, continuing to exercise   Irregular menstrual bleeding -Provided reassurance to patient, counseled that this is a common side effect of nexplanon.  -Follow up in 2-3 months, PAP smear at that time  Healthcare maintenance -Received influenza vaccine today, no complications noted. Denies having complications in the past.  -Due for PAP smear, follow up in 2-3 months for this        Gerilyn Pilgrim, DO [facility]

## 2020-06-12 NOTE — Assessment & Plan Note (Signed)
-  recheck A1c in 2 months, consider BMP glucose check at that time -referral to nutritionist placed, patient instructed to call Dr. Gerilyn Pilgrim next month to schedule appointment -Counseled on continuing lifestyle modifications, meal prepping, continuing to exercise

## 2020-06-12 NOTE — Patient Instructions (Signed)
It was so great seeing you again today!  First of all, congratulations on all the wonderful changes that you have made. Please do not be discouraged on your slight weight gain, you are building long-term good habits that can help with prevent not only diabetes but other conditions as well.   Today we discussed extensively about eating healthy and exercising regularly. I am glad that you are doing those things. Please continue! If possible, try to meal prep so that you are eating more home cooked meals. Try eating an earlier dinner as well so that you allow the food to metabolize better before going to bed.  Please follow up within 2-3 months for a PAP smear.   I will refer you to a nutritionist to further discuss diet.   Thank you for allowing me to be a part of your medical care!

## 2020-11-02 ENCOUNTER — Other Ambulatory Visit: Payer: Self-pay

## 2020-11-02 ENCOUNTER — Ambulatory Visit: Payer: 59 | Admitting: Family Medicine

## 2020-11-02 VITALS — BP 118/84 | HR 98 | Ht 63.0 in | Wt 181.2 lb

## 2020-11-02 DIAGNOSIS — L853 Xerosis cutis: Secondary | ICD-10-CM

## 2020-11-02 DIAGNOSIS — Z114 Encounter for screening for human immunodeficiency virus [HIV]: Secondary | ICD-10-CM

## 2020-11-02 NOTE — Progress Notes (Signed)
    SUBJECTIVE:   CHIEF COMPLAINT / HPI:   Dry scaly skin Patient reports that she is recently noticed she has very dry flaky skin all over her body.  Denies any erythema, itching.  Denies that she has changed soaps, conditioners, laundry detergents.  Is unable to identify any differences in her daily habits other than she is spending more time inside with the heat on because it is so cold outside.  She was reading online that possible signs of HIV or scaly/dry skin and would like an HIV check today.  She is sexually active in a monogamous relationship.  We discussed that it is not likely but she would still like to get tested today which is acceptable.  OBJECTIVE:   BP 118/84   Pulse 98   Ht 5\' 3"  (1.6 m)   Wt 181 lb 3.2 oz (82.2 kg)   SpO2 100%   BMI 32.10 kg/m   General: Well-appearing 30 year old female in no acute distress Derm: Patient has areas of dry skin on her chest abdomen, back, arms.  No erythema or lesions noted.  No wounds noted.  ASSESSMENT/PLAN:   Dry skin dermatitis Patient reports issues with scaly dry skin all over her body for the past few months.  Causes difficult to identify although patient does report she has been spending more time inside with central heat on.  Physical exam was reassuring with no erythema, lesions noted.  Recommended use of moisturizers and follow-up as needed.   STD screening Discussed the need for STD screening and patient reports that she just wants to be tested for HIV.  Denies any high risk sexual behavior but would just like to be checked.  HIV screening collected today.  25, MD [facility]

## 2020-11-02 NOTE — Patient Instructions (Signed)
It was a pleasure seeing you today.  Your dry skin looks like it is mostly due to dry hair.  I recommend continued use of moisturizers.  If you have worsening symptoms such as redness, fever, draining from any lesions please let me know.  If you have any questions or concerns please refer to call the clinic.  I hope you have a wonderful afternoon!  Dry Skin Care  What causes dry skin?  Dry skin is common and results from inadequate moisture in the outer skin layers. Dry skin usually results from the excessive loss of moisture from the skin surface. This occurs due to two major factors: 1. Normally the skin's oil glands deposit a layer of oil on the skin's surface. This layer of oil prevents the loss of moisture from the skin. Exposure to soaps, cleaners, solvents, and disinfectants removes this oily film, allowing water to escape. 2. Water loss from the skin increases when the humidity is low. During winter months we spend a lot of time indoors where the air is heated. Heated air has very low humidity. This also contributes to dry skin.  A tendency for dry skin may accompany such disorders as eczema. Also, as people age, the number of functioning oil glands decreases, and the tendency toward dry skin can be a sensation of skin tightness when emerging from the shower.  How do I manage dry skin?  1. Humidify your environment. This can be accomplished by using a humidifier in your bedroom at night during winter months. 2. Bathing can actually put moisture back into your skin if done right. Take the following steps while bathing to sooth dry skin:  Avoid hot water, which only dries the skin and makes itching worse. Use warm water.  Avoid washcloths or extensive rubbing or scrubbing.  Use mild soaps like unscented Dove, Oil of Olay, Cetaphil, Basis, or CeraVe.  If you take baths rather than showers, rinse off soap residue with clean water before getting out of tub.  Once out of the shower/tub,  pat dry gently with a soft towel. Leave your skin damp.  While still damp, apply any medicated ointment/cream you were prescribed to the affected areas. After you apply your medicated ointment/cream, then apply your moisturizer to your whole body.This is the most important step in dry skin care. If this is omitted, your skin will continue to be dry.  The choice of moisturizer is also very important. In general, lotion will not provider enough moisture to severely dry skin because it is water based. You should use an ointment or cream. Moisturizers should also be unscented. Good choices include Vaseline (plain petrolatum), Aquaphor, Cetaphil, CeraVe, Vanicream, DML Forte, Aveeno moisture, or Eucerin Cream.  Bath oils can be helpful, but do not replace the application of moisturizer after the bath. In addition, they make the tub slippery causing an increased risk for falls. Therefore, we do not recommend their use.

## 2020-11-03 LAB — HIV ANTIBODY (ROUTINE TESTING W REFLEX): HIV Screen 4th Generation wRfx: NONREACTIVE

## 2020-11-04 DIAGNOSIS — L853 Xerosis cutis: Secondary | ICD-10-CM | POA: Insufficient documentation

## 2020-11-04 NOTE — Assessment & Plan Note (Signed)
Patient reports issues with scaly dry skin all over her body for the past few months.  Causes difficult to identify although patient does report she has been spending more time inside with central heat on.  Physical exam was reassuring with no erythema, lesions noted.  Recommended use of moisturizers and follow-up as needed.

## 2020-11-05 ENCOUNTER — Encounter: Payer: Self-pay | Admitting: Family Medicine

## 2021-02-01 ENCOUNTER — Encounter: Payer: Self-pay | Admitting: Family Medicine

## 2021-03-26 ENCOUNTER — Ambulatory Visit: Payer: 59 | Admitting: Student in an Organized Health Care Education/Training Program

## 2021-03-28 ENCOUNTER — Ambulatory Visit: Payer: 59 | Admitting: Family Medicine

## 2021-04-22 ENCOUNTER — Ambulatory Visit: Payer: 59 | Admitting: Family Medicine

## 2021-05-17 ENCOUNTER — Ambulatory Visit: Payer: 59 | Admitting: Family Medicine

## 2021-07-19 ENCOUNTER — Other Ambulatory Visit: Payer: Self-pay | Admitting: Family Medicine

## 2021-07-19 MED ORDER — BENZONATATE 100 MG PO CAPS: 100.0000 mg | ORAL_CAPSULE | Freq: Two times a day (BID) | ORAL | 0 refills | Status: DC | PRN

## 2021-10-22 ENCOUNTER — Other Ambulatory Visit: Payer: Self-pay

## 2021-10-22 ENCOUNTER — Encounter: Payer: Self-pay | Admitting: Family Medicine

## 2021-10-22 ENCOUNTER — Ambulatory Visit: Payer: 59 | Admitting: Family Medicine

## 2021-10-22 VITALS — BP 129/100 | HR 88 | Wt 185.6 lb

## 2021-10-22 DIAGNOSIS — Z23 Encounter for immunization: Secondary | ICD-10-CM | POA: Diagnosis not present

## 2021-10-22 DIAGNOSIS — R635 Abnormal weight gain: Secondary | ICD-10-CM

## 2021-10-22 DIAGNOSIS — E1169 Type 2 diabetes mellitus with other specified complication: Secondary | ICD-10-CM

## 2021-10-22 DIAGNOSIS — R7303 Prediabetes: Secondary | ICD-10-CM | POA: Diagnosis not present

## 2021-10-22 LAB — POCT GLYCOSYLATED HEMOGLOBIN (HGB A1C): HbA1c, POC (controlled diabetic range): 7.8 % — AB (ref 0.0–7.0)

## 2021-10-22 MED ORDER — MOUNJARO 2.5 MG/0.5ML ~~LOC~~ SOAJ: 2.5000 mg | SUBCUTANEOUS | 0 refills | Status: DC

## 2021-10-22 MED ORDER — TRULICITY 0.75 MG/0.5ML ~~LOC~~ SOAJ: 0.7500 mg | SUBCUTANEOUS | 0 refills | Status: DC

## 2021-10-22 MED ORDER — ATORVASTATIN CALCIUM 20 MG PO TABS: 20.0000 mg | ORAL_TABLET | Freq: Every day | ORAL | 3 refills | Status: AC

## 2021-10-22 NOTE — Progress Notes (Signed)
° ° °  SUBJECTIVE:   CHIEF COMPLAINT / HPI:   Prediabetes - last A1c in pre-diabetic range - patient wants to check A1c today - both parents have T2DM - amenable to subcu injectable  Lab Results  Component Value Date   HGBA1C 7.8 (A) 10/22/2021   HGBA1C 6.4 05/10/2020   HGBA1C 5.7 (A) 12/21/2018   Lab Results  Component Value Date   LDLCALC Comment 12/21/2018   CREATININE 0.64 05/10/2020    Weight gain - believes she has gained about 40 pounds in the last 3 years - no new medications or injuries - exercise at the gym has been a challenge because of her work hours - feels like her diet is pretty healthy recently, tries to avoid fried foods, eat vegetables  PERTINENT  PMH / PSH:  Patient Active Problem List   Diagnosis Date Noted   Diabetes mellitus ([place]) 10/23/2021   Need for immunization against influenza 10/23/2021   Dry skin dermatitis 11/04/2020   Healthcare maintenance 06/12/2020   Weight gain 12/21/2018   RHINITIS, ALLERGIC 12/03/2006     OBJECTIVE:   BP (!) 129/100    Pulse 88    Wt 185 lb 9.6 oz (84.2 kg)    SpO2 98%    BMI 32.88 kg/m    PHQ-9:  Depression screen [facility] 2/9 10/22/2021 11/02/2020 06/12/2020  Decreased Interest 1 0 1  Down, Depressed, Hopeless 0 0 1  PHQ - 2 Score 1 0 2  Altered sleeping 1 1 2   Tired, decreased energy 1 1 1   Change in appetite 2 2 0  Feeling bad or failure about yourself  0 1 0  Trouble concentrating 1 0 0  Moving slowly or fidgety/restless 0 0 0  Suicidal thoughts 0 0 0  PHQ-9 Score 6 5 5   Difficult doing work/chores - Not difficult at all Not difficult at all     GAD-7: No flowsheet data found.   Physical Exam General: Awake, alert, oriented Cardiovascular: Regular rate and rhythm, S1 and S2 present, no murmurs auscultated Respiratory: Lung fields clear to auscultation bilaterally Extremities: No bilateral lower extremity edema, palpable pedal and pretibial pulses bilaterally Neuro: Cranial nerves II through X grossly  intact, able to move all extremities spontaneously   ASSESSMENT/PLAN:   Prediabetes A1c today 7.8, increased from 6.4 in 2021. Now solidly in diabetic range. Please see T2DM problem for more.   Diabetes mellitus ([place]) New diagnosis. A1c 7.8 today, first measurement within diabetes range. Patient also desires weight loss, amenable to injectables. Future lab orders placed for CBC, CMP, lipid panel, urine microalbumin, and UA; patient will return Thursday for specimen collection. Start Mounjaro 2.5 mg weekly, increase to 5 mg weekly after 4 weeks if tolerated. Start atorvastatin 20 mg.   Weight gain Patient now diabetic. Will start Mounjaro. Referred to Dr. Jenne Campus, our nutritionist. Follow up at future visits.      Ezequiel Essex, MD [place]

## 2021-10-22 NOTE — Patient Instructions (Addendum)
It was wonderful to see you today. Thank you for allowing me to be a part of your care. Below is a short summary of what we discussed at your visit today:  Diabetes  Your A1c (blood sugar average) today was 7.8. This is solidly in the diabetes range.  - Start taking your statin medication (atorvastatin) daily - Start taking your injectable medication (Mounjaro) once weekly  - See the hand out for more info on Mounjaro  Come back Thursday morning for your labs (blood and urine samples needed)     Please bring all of your medications to every appointment!  If you have any questions or concerns, please do not hesitate to contact us via phone or MyChart message.   Fayette Pho, MD

## 2021-10-23 ENCOUNTER — Encounter: Payer: Self-pay | Admitting: Family Medicine

## 2021-10-23 DIAGNOSIS — Z23 Encounter for immunization: Secondary | ICD-10-CM

## 2021-10-23 DIAGNOSIS — E119 Type 2 diabetes mellitus without complications: Secondary | ICD-10-CM | POA: Insufficient documentation

## 2021-10-23 HISTORY — DX: Encounter for immunization: Z23

## 2021-10-23 NOTE — Assessment & Plan Note (Signed)
A1c today 7.8, increased from 6.4 in 2021. Now solidly in diabetic range. Please see T2DM problem for more.

## 2021-10-23 NOTE — Assessment & Plan Note (Signed)
New diagnosis. A1c 7.8 today, first measurement within diabetes range. Patient also desires weight loss, amenable to injectables. Future lab orders placed for CBC, CMP, lipid panel, urine microalbumin, and UA; patient will return Thursday for specimen collection. Start Mounjaro 2.5 mg weekly, increase to 5 mg weekly after 4 weeks if tolerated. Start atorvastatin 20 mg.

## 2021-10-23 NOTE — Assessment & Plan Note (Signed)
Patient now diabetic. Will start Mounjaro. Referred to Dr. Gerilyn Pilgrim, our nutritionist. Follow up at future visits.

## 2021-10-24 ENCOUNTER — Other Ambulatory Visit: Payer: 59

## 2021-10-24 ENCOUNTER — Other Ambulatory Visit: Payer: Self-pay

## 2021-10-24 DIAGNOSIS — E1169 Type 2 diabetes mellitus with other specified complication: Secondary | ICD-10-CM

## 2021-10-25 ENCOUNTER — Encounter: Payer: Self-pay | Admitting: Family Medicine

## 2021-10-25 DIAGNOSIS — R635 Abnormal weight gain: Secondary | ICD-10-CM

## 2021-10-25 DIAGNOSIS — E1169 Type 2 diabetes mellitus with other specified complication: Secondary | ICD-10-CM

## 2021-10-25 LAB — CBC
Hematocrit: 43.3 % (ref 34.0–46.6)
Hemoglobin: 13.9 g/dL (ref 11.1–15.9)
MCH: 26.7 pg (ref 26.6–33.0)
MCHC: 32.1 g/dL (ref 31.5–35.7)
MCV: 83 fL (ref 79–97)
Platelets: 301 10*3/uL (ref 150–450)
RBC: 5.21 x10E6/uL (ref 3.77–5.28)
RDW: 13.2 % (ref 11.7–15.4)
WBC: 7.5 10*3/uL (ref 3.4–10.8)

## 2021-10-25 LAB — COMPREHENSIVE METABOLIC PANEL
ALT: 62 IU/L — ABNORMAL HIGH (ref 0–32)
AST: 46 IU/L — ABNORMAL HIGH (ref 0–40)
Albumin/Globulin Ratio: 1.4 (ref 1.2–2.2)
Albumin: 3.9 g/dL (ref 3.8–4.8)
Alkaline Phosphatase: 66 IU/L (ref 44–121)
BUN/Creatinine Ratio: 21 (ref 9–23)
BUN: 11 mg/dL (ref 6–20)
Bilirubin Total: 0.4 mg/dL (ref 0.0–1.2)
CO2: 22 mmol/L (ref 20–29)
Calcium: 9.5 mg/dL (ref 8.7–10.2)
Chloride: 102 mmol/L (ref 96–106)
Creatinine, Ser: 0.52 mg/dL — ABNORMAL LOW (ref 0.57–1.00)
Globulin, Total: 2.8 g/dL (ref 1.5–4.5)
Glucose: 146 mg/dL — ABNORMAL HIGH (ref 70–99)
Potassium: 4.7 mmol/L (ref 3.5–5.2)
Sodium: 137 mmol/L (ref 134–144)
Total Protein: 6.7 g/dL (ref 6.0–8.5)
eGFR: 127 mL/min/{1.73_m2} (ref >59)

## 2021-10-25 LAB — MICROALBUMIN / CREATININE URINE RATIO
Creatinine, Urine: 32.4 mg/dL
Microalb/Creat Ratio: 30 mg/g creat — ABNORMAL HIGH (ref 0–29)
Microalbumin, Urine: 9.8 ug/mL

## 2021-10-25 LAB — LIPID PANEL
Chol/HDL Ratio: 3.9 ratio (ref 0.0–4.4)
Cholesterol, Total: 203 mg/dL — ABNORMAL HIGH (ref 100–199)
HDL: 52 mg/dL (ref >39)
LDL Chol Calc (NIH): 113 mg/dL — ABNORMAL HIGH (ref 0–99)
Triglycerides: 221 mg/dL — ABNORMAL HIGH (ref 0–149)
VLDL Cholesterol Cal: 38 mg/dL (ref 5–40)

## 2021-10-28 ENCOUNTER — Encounter: Payer: Self-pay | Admitting: Family Medicine

## 2021-10-28 DIAGNOSIS — E1169 Type 2 diabetes mellitus with other specified complication: Secondary | ICD-10-CM

## 2021-11-21 ENCOUNTER — Other Ambulatory Visit: Payer: Self-pay | Admitting: Family Medicine

## 2021-11-21 DIAGNOSIS — E1169 Type 2 diabetes mellitus with other specified complication: Secondary | ICD-10-CM

## 2021-11-21 MED ORDER — MOUNJARO 2.5 MG/0.5ML ~~LOC~~ SOAJ: 2.5000 mg | SUBCUTANEOUS | 3 refills | Status: DC

## 2021-11-22 MED ORDER — MOUNJARO 5 MG/0.5ML ~~LOC~~ SOAJ: 5.0000 mg | SUBCUTANEOUS | 3 refills | Status: DC

## 2021-11-22 MED ORDER — FREESTYLE LIBRE SENSOR SYSTEM MISC: 2 refills | Status: DC

## 2021-11-22 MED ORDER — FREESTYLE LIBRE READER DEVI: 2 refills | Status: DC

## 2021-11-25 ENCOUNTER — Other Ambulatory Visit (HOSPITAL_COMMUNITY): Payer: Self-pay

## 2021-12-04 ENCOUNTER — Other Ambulatory Visit: Payer: Self-pay | Admitting: Family Medicine

## 2021-12-04 DIAGNOSIS — E1169 Type 2 diabetes mellitus with other specified complication: Secondary | ICD-10-CM

## 2021-12-04 MED ORDER — DEXCOM G7 RECEIVER DEVI: 1.0000 | 11 refills | Status: DC

## 2021-12-04 MED ORDER — DEXCOM G7 SENSOR MISC: 1.0000 | 6 refills | Status: DC

## 2021-12-04 NOTE — Addendum Note (Signed)
Addended by: Valetta Close on: 12/04/2021 05:12 PM ? ? Modules accepted: Orders ? ?

## 2021-12-04 NOTE — Progress Notes (Signed)
Direct LDL to follow up cholesterol s/p lipitor 20 mg start. Due mid-March.  ?Fayette Pho, MD ? ?

## 2022-03-11 ENCOUNTER — Encounter: Payer: Self-pay | Admitting: *Deleted

## 2022-04-29 ENCOUNTER — Ambulatory Visit (INDEPENDENT_AMBULATORY_CARE_PROVIDER_SITE_OTHER): Payer: 59 | Admitting: Student

## 2022-04-29 ENCOUNTER — Encounter: Payer: Self-pay | Admitting: Student

## 2022-04-29 VITALS — BP 108/72 | HR 97 | Ht 63.0 in | Wt 136.0 lb

## 2022-04-29 DIAGNOSIS — Z30017 Encounter for initial prescription of implantable subdermal contraceptive: Secondary | ICD-10-CM

## 2022-04-29 DIAGNOSIS — Z3046 Encounter for surveillance of implantable subdermal contraceptive: Secondary | ICD-10-CM | POA: Diagnosis not present

## 2022-04-29 LAB — POCT URINE PREGNANCY: Preg Test, Ur: NEGATIVE

## 2022-04-29 MED ORDER — ETONOGESTREL 68 MG ~~LOC~~ IMPL
68.0000 mg | DRUG_IMPLANT | Freq: Once | SUBCUTANEOUS | Status: AC
  Administered 2022-04-29: 68 mg via SUBCUTANEOUS

## 2022-04-29 NOTE — Patient Instructions (Signed)
It was great to see you, Kathryn French!  Today we removed your old Nexplanon and inserted a new Nexplanon in your left arm.  This will be effective at preventing pregnancy for 3 years (until 2026), but does not protect against sexually-transmitted infections so please use condoms with intercourse.  Wear the bandage on your arm for the next 24 hours to reduce your risk of bleeding and bruising.  Best wishes, Dr. Melissa Noon

## 2022-04-29 NOTE — Progress Notes (Unsigned)
    SUBJECTIVE:   CHIEF COMPLAINT / HPI:   Kathryn French presents for Nexplanon implant removal and insertion. Discussed risks and benefits. Consent obtained. See procedure notes below.  Patient is a non-smoker with no history of blood clots. Blood pressure wnl.  PERTINENT  PMH / PSH: Reviewed  OBJECTIVE:   BP 108/72   Pulse 97   Ht 5\' 3"  (1.6 m)   Wt 136 lb (61.7 kg)   SpO2 99%   BMI 24.09 kg/m   General: Well-appearing, no distress Resp: Normal WOB on room air Neuro: A&O, speech clear and fluent  Progestin Implant Removal Note Kathryn French is here for removal of her etonogestrel rod implant (Implanon/Nexplanon) with reinsertion of new device.  An informed consent was taken prior to removal and is to be scanned into the Electronic Health Record.  Risks of the procedure include: bleeding, infection, difficulty with removal, scarring and nerve damage. There may be bruising at the site of incision and down the arm.  Procedure Note: Time out taken: 3:10 pm  Team: Williams Che, DO Darral Dash, CMA  Procedure: Progestin Implant Removal  Procedure confirmed by patient and team: Yes  Side: Left  Position correct for procedure- Yes  Equipment for procedure available- Yes  The patient is place in the supine position. Aseptic conditions are maintained. The rod is located by palpation. The area is cleaned with antiseptic. 5 cc of 1% lidocaine with epinephrine is injected just underneath the end of the implant closest to the elbow. After firmly pressing down on the end of the implant closer to the axilla a 2-3 mm incision is made with a scalpel. The rod is pushed to the incision site and grasped with a mosquito forceps and gently removed. Blunt dissection was not needed. The patient did tolerate the procedure well. The rod was removed in its entirety. The patient would like to use another nexplanon for her contraception. See note below:  Nexplanon Insertion Procedure  Note Nexplanon removed form packaging,  Device confirmed in needle, then inserted full length of needle and withdrawn per handbook instructions. Provider and patient verified presence of the implant in the woman's arm by palpation. Pt insertion site was covered with steristrips/adhesive bandage and pressure bandage. There was minimal blood loss. Patient tolerated procedure well.  ASSESSMENT/PLAN:   Insertion of Nexplanon Removed implant, tolerated well. Reviewed return precautions. Inserted new Nexplanon in left arm, tolerated well. Next due for removal 04/2025.  Patient was given post procedure instructions and Nexplanon user card with expiration date. Condoms were recommended for STI prevention.  Patient was asked to keep the pressure dressing on for 24 hours to minimize bruising and keep the adhesive bandage on for 3-5 days. The patient verbalized understanding of the plan of care and agrees.   Lot # [id number] Expiration Date: 02/26/2024    02/28/2024, DO [facility]

## 2022-04-30 DIAGNOSIS — Z30017 Encounter for initial prescription of implantable subdermal contraceptive: Secondary | ICD-10-CM | POA: Insufficient documentation

## 2022-04-30 NOTE — Assessment & Plan Note (Addendum)
Removed implant, tolerated well. Reviewed return precautions. Inserted new Nexplanon in left arm, tolerated well. Next due for removal 04/2025.  Patient was given post procedure instructions and Nexplanon user card with expiration date. Condoms were recommended for STI prevention.  Patient was asked to keep the pressure dressing on for 24 hours to minimize bruising and keep the adhesive bandage on for 3-5 days. The patient verbalized understanding of the plan of care and agrees.   Lot # [id number] Expiration Date: 02/26/2024

## 2022-06-02 ENCOUNTER — Ambulatory Visit: Payer: 59 | Admitting: Family Medicine

## 2022-06-12 NOTE — Patient Instructions (Addendum)
It was wonderful to see you today. Thank you for allowing me to be a part of your care. Below is a short summary of what we discussed at your visit today:  A1c check Today your A1c was 5.0, compared to 7.8 in January. Continue the [facility] weekly injections, but reduce to the 2.5 mg dose.  Please go to the ophthalmologist for a diabetic eye exam. You have been referred to the clinic below.  [facility]  Address: [address], [place], [place] [address] Phone: [phone]  Pap smear Please schedule a Pap smear at your earliest convenience.  We do not have one on file for you.  Health Maintenance We like to think about ways to keep you healthy for years to come. Below are some interventions and screenings we can offer to keep you healthy: - PAP smear (please schedule as soon as is convenient for you) - COVID vaccine (available at your favorite pharmacy) -  influenza vaccine (available here in clinic mid-September)  Please bring all of your medications to every appointment!  If you have any questions or concerns, please do not hesitate to contact us via phone or MyChart message.   Fayette Pho, MD

## 2022-06-13 ENCOUNTER — Ambulatory Visit (INDEPENDENT_AMBULATORY_CARE_PROVIDER_SITE_OTHER): Payer: 59 | Admitting: Family Medicine

## 2022-06-13 ENCOUNTER — Encounter: Payer: Self-pay | Admitting: Family Medicine

## 2022-06-13 VITALS — BP 118/64 | Ht 63.0 in | Wt 127.0 lb

## 2022-06-13 DIAGNOSIS — E1169 Type 2 diabetes mellitus with other specified complication: Secondary | ICD-10-CM | POA: Diagnosis not present

## 2022-06-13 LAB — POCT GLYCOSYLATED HEMOGLOBIN (HGB A1C): HbA1c, POC (controlled diabetic range): 5 % (ref 0.0–7.0)

## 2022-06-13 MED ORDER — MOUNJARO 2.5 MG/0.5ML ~~LOC~~ SOAJ: 2.5000 mg | SUBCUTANEOUS | 2 refills | Status: DC

## 2022-06-13 NOTE — Assessment & Plan Note (Signed)
A1c greatly improved, 5.0 today down from 7.8 in January.  Also experienced a significant 60 pound weight loss since starting Mounjaro in January.  Discussed with patient reducing or stopping, patient elects to reduce dose.  She does not want to gain back weight when she stops the medication.  I will send in prescription for Mounjaro 2.5 mg weekly to start after she finishes her current 5 mg pens.  Next recheck 3 months.

## 2022-06-13 NOTE — Progress Notes (Signed)
    SUBJECTIVE:   CHIEF COMPLAINT / HPI:   T2DM Patient presents for diabetes follow-up, including A1c check and Mounjaro tolerance since starting in January.  Patient was initiated on Mounjaro in January at 2.5 mg weekly, increase to 5 mg given that she reported good tolerance.  She reports that she is eating a fairly normal amount, but does experience nausea after eating.  No emesis.  Also says certain smells make her stomach upset.  Weight down significantly  185.6 lb in January (BMI 32.8) 136 July (BMI 24.09) 126 today (BMI 22.5)  Lab Results  Component Value Date   HGBA1C 5.0 06/13/2022   HGBA1C 7.8 (A) 10/22/2021   HGBA1C 6.4 05/10/2020   Lab Results  Component Value Date   LDLCALC 113 (H) 10/24/2021   CREATININE 0.52 (L) 10/24/2021   PERTINENT  PMH / PSH:  Patient Active Problem List   Diagnosis Date Noted   Insertion of Nexplanon 04/30/2022   Diabetes mellitus (HCC) 10/23/2021   Dry skin dermatitis 11/04/2020   Healthcare maintenance 06/12/2020   Weight gain 12/21/2018   RHINITIS, ALLERGIC 12/03/2006    OBJECTIVE:   BP 118/64   Ht 5\' 3"  (1.6 m)   Wt 127 lb (57.6 kg)   BMI 22.50 kg/m    PHQ-9:     06/13/2022   10:24 AM 04/29/2022    2:49 PM 10/22/2021    2:51 PM  Depression screen PHQ 2/9  Decreased Interest 0 1 1  Down, Depressed, Hopeless 0 1 0  PHQ - 2 Score 0 2 1  Altered sleeping 1 0 1  Tired, decreased energy 0 0 1  Change in appetite 2 2 2   Feeling bad or failure about yourself  0 1 0  Trouble concentrating 0 0 1  Moving slowly or fidgety/restless 0 0 0  Suicidal thoughts 0 0 0  PHQ-9 Score 3 5 6   Difficult doing work/chores Not difficult at all Somewhat difficult    Physical Exam General: Awake, alert, oriented Cardiovascular: Regular rate and rhythm, S1 and S2 present, no murmurs auscultated Respiratory: Lung fields clear to auscultation bilaterally  ASSESSMENT/PLAN:   Diabetes mellitus (HCC) A1c greatly improved, 5.0 today down  from 7.8 in January.  Also experienced a significant 60 pound weight loss since starting Mounjaro in January.  Discussed with patient reducing or stopping, patient elects to reduce dose.  She does not want to gain back weight when she stops the medication.  I will send in prescription for Mounjaro 2.5 mg weekly to start after she finishes her current 5 mg pens.  Next recheck 3 months.     , MD [facility]

## 2022-07-07 ENCOUNTER — Telehealth: Payer: Self-pay

## 2022-07-07 DIAGNOSIS — E1169 Type 2 diabetes mellitus with other specified complication: Secondary | ICD-10-CM

## 2022-07-07 NOTE — Telephone Encounter (Signed)
Patient calls nurse line regarding elevated blood sugar levels for the last week. She was decreased to 2.5 mg Mounjaro on 06/13/22.  Patient reports that for the last week, fasting blood sugars have been in the 180's and post prandial blood sugars have been in the low to mid 200's. Patient is asymptomatic.   Patient is asking if she should adjust her mounjaro dosage.   Will forward to PCP for next steps.   Talbot Grumbling, RN

## 2022-07-07 NOTE — Telephone Encounter (Signed)
Yes I think that would be find to increase her Mounjaro back to 5 mg. I can send in new rx if needed

## 2022-07-08 MED ORDER — TIRZEPATIDE 5 MG/0.5ML ~~LOC~~ SOAJ: 5.0000 mg | SUBCUTANEOUS | 0 refills | Status: DC

## 2022-07-08 NOTE — Telephone Encounter (Signed)
Patient contacted and advised to increase to 5mg .   Patient is requesting a new prescription for 5mg .   Will forward to PCP.

## 2022-07-08 NOTE — Telephone Encounter (Signed)
New rx sent

## 2022-08-22 ENCOUNTER — Ambulatory Visit (INDEPENDENT_AMBULATORY_CARE_PROVIDER_SITE_OTHER): Payer: 59 | Admitting: Student

## 2022-08-22 DIAGNOSIS — Z91199 Patient's noncompliance with other medical treatment and regimen due to unspecified reason: Secondary | ICD-10-CM

## 2022-08-22 NOTE — Progress Notes (Unsigned)
    SUBJECTIVE:   CHIEF COMPLAINT / HPI:   Vaginal Discharge: Patient is a 31 y.o. female presenting with vaginal discharge for *** days.  She states the discharge is of *** consistency.  She endorses *** vaginal odor.  She is interested in screening for sexually transmitted infections today.  PERTINENT  PMH / PSH: ***None relevant  OBJECTIVE:   There were no vitals taken for this visit.   General: NAD, pleasant, able to participate in exam Respiratory: Normal effort, no obvious respiratory distress Pelvic: VULVA: normal appearing vulva with no masses, tenderness or lesions, VAGINA: Normal appearing vagina with normal color, no lesions, with {GYN VAGINAL DISCHARGE:21986} discharge present, ***CERVIX: No lesions, {GYN VAGINAL DISCHARGE:21986} discharge present,  Chaperone *** present for pelvic exam  ASSESSMENT/PLAN:   No problem-specific Assessment & Plan notes found for this encounter.    Assessment:  31 y.o. female with vaginal discharge for***days, as well as***.  Physical exam significant for*** discharge.  Wet prep performed today shows *** consistent with ***.  Patient is interested in STI screening.   Plan: -Wet prep as above.  Will treat with***. -GC/chlamydia pending -Will check HIV and RPR  Darral Dash, DO [facility]

## 2022-08-22 NOTE — Patient Instructions (Signed)
It was great seeing you today.  We collected testing today- I will call you if it is abnormal. If your results are normal, I will send you a MyChart message.   If you have any questions or concerns, please feel free to call the clinic.   Have a wonderful day,  Dr. Hollyn Stucky [facility] [phone]   

## 2022-08-24 NOTE — Progress Notes (Signed)
No show

## 2022-09-04 NOTE — Progress Notes (Deleted)
    SUBJECTIVE:   CHIEF COMPLAINT / HPI:   STI check Can also do PAP?  PERTINENT  PMH / PSH: ***  OBJECTIVE:   There were no vitals taken for this visit. ***  General: NAD, pleasant, able to participate in exam Cardiac: RRR, no murmurs. Respiratory: CTAB, normal effort, No wheezes, rales or rhonchi Abdomen: Bowel sounds present, nontender, nondistended Extremities: no edema or cyanosis. Skin: warm and dry, no rashes noted Neuro: alert, no obvious focal deficits Psych: Normal affect and mood  ASSESSMENT/PLAN:   No problem-specific Assessment & Plan notes found for this encounter.     Dr. Elberta Fortis, DO [facility]    {    This will disappear when note is signed, click to select method of visit    :1}

## 2022-09-05 ENCOUNTER — Ambulatory Visit: Payer: 59 | Admitting: Family Medicine

## 2022-09-05 DIAGNOSIS — Z113 Encounter for screening for infections with a predominantly sexual mode of transmission: Secondary | ICD-10-CM

## 2022-09-10 ENCOUNTER — Telehealth: Payer: Self-pay

## 2022-09-10 NOTE — Telephone Encounter (Signed)
A Prior Authorization was initiated for this patients MOUNJARO through CoverMyMeds.   Key: Allegra Lai

## 2022-09-15 ENCOUNTER — Encounter: Payer: Self-pay | Admitting: Family Medicine

## 2022-09-15 DIAGNOSIS — E1169 Type 2 diabetes mellitus with other specified complication: Secondary | ICD-10-CM

## 2022-09-15 MED ORDER — SEMAGLUTIDE(0.25 OR 0.5MG/DOS) 2 MG/1.5ML ~~LOC~~ SOPN: 0.5000 mg | PEN_INJECTOR | SUBCUTANEOUS | 2 refills | Status: DC

## 2022-09-15 NOTE — Telephone Encounter (Signed)
Patient calls nurse line in regards to Kathryn French.   She reports Greggory Keen is not covered by her insurance anymore.   She reports her insurance company is going to be faxing over alternatives.   Will forward to [facility].

## 2022-09-16 ENCOUNTER — Telehealth: Payer: Self-pay

## 2022-09-16 NOTE — Telephone Encounter (Signed)
Patient calls nurse line regarding Mounjaro and Ozempic. Patient reports that insurance is not going to cover [facility] and provider sent in alternative, Ozempic.   Called pharmacy. Ozempic is requiring a PA. Pharmacist reports that they faxed over the paperwork to start the PA. Will forward to Vivian for further assistance.   Veronda Prude, RN

## 2022-09-17 ENCOUNTER — Other Ambulatory Visit (HOSPITAL_COMMUNITY): Payer: Self-pay

## 2022-09-17 NOTE — Telephone Encounter (Signed)
Cancelled PA request for [facility]. New request for Ozempic initiated.

## 2022-09-17 NOTE — Telephone Encounter (Signed)
A Prior Authorization was initiated for this patients OZEMPIC through CoverMyMeds.   Key: [id number]

## 2022-09-19 ENCOUNTER — Ambulatory Visit: Payer: 59 | Admitting: Family Medicine

## 2022-09-19 DIAGNOSIS — E785 Hyperlipidemia, unspecified: Secondary | ICD-10-CM

## 2022-10-02 ENCOUNTER — Other Ambulatory Visit (HOSPITAL_COMMUNITY): Payer: Self-pay

## 2022-10-02 NOTE — Telephone Encounter (Signed)
Called insurance company to follow up on both moujaro and ozempic PA's.  Ozempic denied, needs step therapy of metformin.  Prior Auth for patients medication MOUNJARO approved by [facility]. No expiration date given. Letter scanned to media.

## 2022-10-02 NOTE — Telephone Encounter (Signed)
Prior Auth for patients medication OZEMPIC denied by [facility] via CoverMyMeds.   Reason: REQUIRES STEP THERAPY OF METFORMIN

## 2022-10-03 ENCOUNTER — Other Ambulatory Visit (HOSPITAL_COMMUNITY): Payer: Self-pay

## 2022-10-10 ENCOUNTER — Encounter: Payer: Self-pay | Admitting: Family Medicine

## 2022-10-10 ENCOUNTER — Telehealth: Payer: Self-pay

## 2022-10-10 DIAGNOSIS — E1169 Type 2 diabetes mellitus with other specified complication: Secondary | ICD-10-CM

## 2022-10-10 MED ORDER — MOUNJARO 2.5 MG/0.5ML ~~LOC~~ SOAJ: 2.5000 mg | SUBCUTANEOUS | 1 refills | Status: DC

## 2022-10-10 NOTE — Telephone Encounter (Signed)
I have sent in the Mounjaro 2.5 mg weekly dose with refills. Should be enough for 6 months total.  Ezequiel Essex, MD

## 2022-10-10 NOTE — Telephone Encounter (Signed)
Patient calls nurse line regarding mounjaro rx. She states that her new insurance should now cover prescription.   Please clarify if patient should be taking 5 mg or 2.5 mg weekly and send in new prescription.   Talbot Grumbling, RN

## 2022-10-13 ENCOUNTER — Other Ambulatory Visit (HOSPITAL_COMMUNITY): Payer: Self-pay

## 2022-10-14 ENCOUNTER — Telehealth: Payer: Self-pay

## 2022-10-14 ENCOUNTER — Other Ambulatory Visit (HOSPITAL_COMMUNITY): Payer: Self-pay

## 2022-10-14 NOTE — Telephone Encounter (Signed)
A Prior Authorization was initiated for this patients MOUNJARO through CoverMyMeds.   Key: Kathryn French

## 2022-10-16 ENCOUNTER — Other Ambulatory Visit (HOSPITAL_COMMUNITY): Payer: Self-pay

## 2022-10-16 NOTE — Telephone Encounter (Signed)
CORRECTION!!  Once again, insurance sent a second fax with a decision change. Mounjaro approved 10/15/22-10/05/38 (?) or until coverage for the medication becomes subject to a change in pharmacy benefits.   Approval letter scanned to media.

## 2022-10-16 NOTE — Telephone Encounter (Signed)
Prior Auth for patients medication MOUNJARO denied by [employer] via fax.   Reason: A1C IS 5.0 WHICH IS NOT INDICATIVE OF TYPE 2 DIABETES. MEDICATIONS USED FOR WEIGHT LOSS ARE NOT COVERED UNDER PLAN.  CoverMyMeds Key: [id number]  Patient aware (via mychart), will attempt Ozempic PA.

## 2022-10-20 ENCOUNTER — Ambulatory Visit: Payer: 59 | Admitting: Family Medicine

## 2022-10-27 MED ORDER — MOUNJARO 5 MG/0.5ML ~~LOC~~ SOAJ: 5.0000 mg | SUBCUTANEOUS | 3 refills | Status: DC

## 2022-12-07 ENCOUNTER — Other Ambulatory Visit: Payer: Self-pay | Admitting: Family Medicine

## 2022-12-07 DIAGNOSIS — E1169 Type 2 diabetes mellitus with other specified complication: Secondary | ICD-10-CM

## 2023-02-16 ENCOUNTER — Other Ambulatory Visit: Payer: Self-pay | Admitting: Family Medicine

## 2023-02-17 MED ORDER — BENZONATATE 100 MG PO CAPS: 100.0000 mg | ORAL_CAPSULE | Freq: Two times a day (BID) | ORAL | 0 refills | Status: DC | PRN

## 2023-03-02 ENCOUNTER — Other Ambulatory Visit: Payer: Self-pay | Admitting: Family Medicine

## 2023-03-02 DIAGNOSIS — E1169 Type 2 diabetes mellitus with other specified complication: Secondary | ICD-10-CM

## 2023-04-05 ENCOUNTER — Other Ambulatory Visit: Payer: Self-pay | Admitting: Family Medicine

## 2023-04-05 DIAGNOSIS — E1169 Type 2 diabetes mellitus with other specified complication: Secondary | ICD-10-CM

## 2023-10-15 LAB — HM DIABETES EYE EXAM

## 2023-12-27 ENCOUNTER — Other Ambulatory Visit: Payer: Self-pay | Admitting: Family Medicine

## 2023-12-27 DIAGNOSIS — E1169 Type 2 diabetes mellitus with other specified complication: Secondary | ICD-10-CM

## 2024-01-24 ENCOUNTER — Other Ambulatory Visit: Payer: Self-pay | Admitting: Student

## 2024-01-24 DIAGNOSIS — E1169 Type 2 diabetes mellitus with other specified complication: Secondary | ICD-10-CM

## 2024-02-27 ENCOUNTER — Other Ambulatory Visit: Payer: Self-pay | Admitting: Student

## 2024-02-27 DIAGNOSIS — E1169 Type 2 diabetes mellitus with other specified complication: Secondary | ICD-10-CM

## 2024-03-08 ENCOUNTER — Other Ambulatory Visit: Payer: Self-pay | Admitting: Family Medicine

## 2024-03-08 DIAGNOSIS — E1169 Type 2 diabetes mellitus with other specified complication: Secondary | ICD-10-CM

## 2024-03-22 ENCOUNTER — Ambulatory Visit: Admitting: Student

## 2024-03-24 ENCOUNTER — Other Ambulatory Visit: Payer: Self-pay | Admitting: Student

## 2024-03-24 ENCOUNTER — Ambulatory Visit (INDEPENDENT_AMBULATORY_CARE_PROVIDER_SITE_OTHER): Payer: Self-pay | Admitting: Student

## 2024-03-24 ENCOUNTER — Encounter: Payer: Self-pay | Admitting: Student

## 2024-03-24 VITALS — BP 112/80 | Ht 63.0 in | Wt 119.6 lb

## 2024-03-24 DIAGNOSIS — H5789 Other specified disorders of eye and adnexa: Secondary | ICD-10-CM | POA: Diagnosis not present

## 2024-03-24 DIAGNOSIS — E1169 Type 2 diabetes mellitus with other specified complication: Secondary | ICD-10-CM | POA: Insufficient documentation

## 2024-03-24 DIAGNOSIS — E785 Hyperlipidemia, unspecified: Secondary | ICD-10-CM | POA: Diagnosis not present

## 2024-03-24 LAB — POCT GLYCOSYLATED HEMOGLOBIN (HGB A1C): Hemoglobin A1C: 5.1 % (ref 4.0–5.6)

## 2024-03-24 MED ORDER — SEMAGLUTIDE(0.25 OR 0.5MG/DOS) 2 MG/3ML ~~LOC~~ SOPN: 0.5000 mg | PEN_INJECTOR | SUBCUTANEOUS | 3 refills | Status: DC

## 2024-03-24 NOTE — Assessment & Plan Note (Addendum)
 A1c very well-controlled, 5.1% -Discontinue Mounjaro , does not tolerate side effects of that she did better on Ozempic . -Prescribed Ozempic  0.5 mg weekly, pending insurance authorization - Has had good success with weight loss on GLP-1 likely improving her insulin resistance, but did discuss importance of protein intake/caloric intake to avoid malnutrition - Also discussed alternatives to GLP-1 therapies for type II diabetics including Jardiance, metformin

## 2024-03-24 NOTE — Patient Instructions (Addendum)
 It was great seeing you today.  As we discussed, - Schedule an appointment for pap smear at the front desk before you leave today. Return for lipid panel as well. - Allergy/asthma will call you to schedule - Ozempic  sent to pharmacy, will see if we can get coverage   If you have any questions or concerns, please feel free to call the clinic.   Have a wonderful day,  Dr. Vallorie Gayer [facility] [phone]

## 2024-03-24 NOTE — Assessment & Plan Note (Signed)
 She has been prescribed statin therapy in the past due to type II diabetic with LDL greater than 100 prior blood work - Given significant weight loss and improvement in A1c, decision making to discontinue statin therapy pending lipid panel at next visit shows LDL less than 100

## 2024-03-24 NOTE — Progress Notes (Signed)
    SUBJECTIVE:   CHIEF COMPLAINT / HPI:   Here for check up, has not been seen at [facility] since 2023.  T2DM -Last A1c 5.0% in September 2023 -Currently taking Mounjaro , on 5 mg weekly (she suffered from nausea  -She feels the side effects are much more tolerable than Ozempic  -Wearing Dexcom -Her postprandial glucose goes to 300, her fasting glucose is usually 120s -She also feels that the stress of work makes her forget to eat which makes her feel ill -She does not want to lose more weight, but does manage blood sugars better  HLD -Prescribed Atorvostatin 20 mg daily -Not currently taking it  ZOX:[id number] Nexplanon  for contraception. Cervical cytology not documented   PERTINENT  PMH / PSH: T2DM, HLD  OBJECTIVE:   BP 112/80   Ht 5' 3 (1.6 m)   Wt 119 lb 9.6 oz (54.3 kg)   BMI 21.19 kg/m   General: NAD, well appearing Cardiac: RRR Neuro: A&O Respiratory: normal WOB on RA. No wheezing or crackles on auscultation, good lung sounds throughout Extremities: Moving all 4 extremities equally  ASSESSMENT/PLAN:   Diabetes mellitus (HCC) A1c very well-controlled, 5.1% -Discontinue Mounjaro , does not tolerate side effects of that she did better on Ozempic . -Prescribed Ozempic  0.5 mg weekly, pending insurance authorization - Has had good success with weight loss on GLP-1 likely improving her insulin resistance, but did discuss importance of protein intake/caloric intake to avoid malnutrition - Also discussed alternatives to GLP-1 therapies for type II diabetics including Jardiance, metformin     Hyperlipidemia associated with type 2 diabetes mellitus (HCC) She has been prescribed statin therapy in the past due to type II diabetic with LDL greater than 100 prior blood work - Given significant weight loss and improvement in A1c, decision making to discontinue statin therapy pending lipid panel at next visit shows LDL less than 100   Patient schedule appointment front desk for  leaving today for cervical cancer screening (we discussed this, do not believe she has ever had this completed) as well as lipid panel.  Willona Phariss, DO [facility]

## 2024-03-31 ENCOUNTER — Ambulatory Visit: Admitting: Student

## 2024-03-31 ENCOUNTER — Other Ambulatory Visit

## 2024-04-01 ENCOUNTER — Other Ambulatory Visit: Payer: Self-pay | Admitting: Student

## 2024-04-01 DIAGNOSIS — E1169 Type 2 diabetes mellitus with other specified complication: Secondary | ICD-10-CM

## 2024-04-28 ENCOUNTER — Ambulatory Visit: Admitting: Student

## 2024-04-28 ENCOUNTER — Other Ambulatory Visit

## 2024-04-29 ENCOUNTER — Other Ambulatory Visit: Payer: Self-pay | Admitting: Student

## 2024-04-29 ENCOUNTER — Ambulatory Visit: Payer: Self-pay | Admitting: Internal Medicine

## 2024-04-29 DIAGNOSIS — E1169 Type 2 diabetes mellitus with other specified complication: Secondary | ICD-10-CM

## 2024-05-25 ENCOUNTER — Other Ambulatory Visit: Payer: Self-pay

## 2024-05-25 ENCOUNTER — Other Ambulatory Visit: Payer: Self-pay | Admitting: Family Medicine

## 2024-05-25 ENCOUNTER — Encounter: Payer: Self-pay | Admitting: Allergy

## 2024-05-25 ENCOUNTER — Ambulatory Visit (INDEPENDENT_AMBULATORY_CARE_PROVIDER_SITE_OTHER): Payer: Self-pay | Admitting: Allergy

## 2024-05-25 VITALS — BP 100/70 | HR 74 | Temp 98.5°F | Resp 18 | Ht 62.5 in | Wt 120.2 lb

## 2024-05-25 DIAGNOSIS — H109 Unspecified conjunctivitis: Secondary | ICD-10-CM

## 2024-05-25 DIAGNOSIS — J310 Chronic rhinitis: Secondary | ICD-10-CM | POA: Diagnosis not present

## 2024-05-25 DIAGNOSIS — T781XXD Other adverse food reactions, not elsewhere classified, subsequent encounter: Secondary | ICD-10-CM

## 2024-05-25 DIAGNOSIS — H1013 Acute atopic conjunctivitis, bilateral: Secondary | ICD-10-CM | POA: Diagnosis not present

## 2024-05-25 DIAGNOSIS — E1169 Type 2 diabetes mellitus with other specified complication: Secondary | ICD-10-CM

## 2024-05-25 MED ORDER — RYALTRIS 665-25 MCG/ACT NA SUSP: NASAL | 1 refills | Status: AC

## 2024-05-25 MED ORDER — EPINEPHRINE 0.3 MG/0.3ML IJ SOAJ: 0.3000 mg | Freq: Once | INTRAMUSCULAR | Status: DC

## 2024-05-25 MED ORDER — EPINEPHRINE 0.3 MG/0.3ML IJ SOAJ: 0.3000 mg | INTRAMUSCULAR | 1 refills | Status: AC | PRN

## 2024-05-25 NOTE — Patient Instructions (Signed)
 Adverse food reaction - will plan to skin test to select foods identified on our food testing sheet - continue your current food avoidance for now - have access to epinephrine  device in case of allergic reaction.  Follow emergency action plan in case of reaction  Environmental allergy - will plan to skin test to environmental allergens - recommend Xyzal 5mg  daily as your antihistamine - reserve benadryl for breakthrough symptoms - use Ryaltris  2 sprays each nostril twice a day as needed for nasal congestion or drainage.  This is a combination spray with Mometasone for congestion control and olopatadine for drainage control.   Aim tip bottle outward toward ear on same side nostril - use Pataday 1 drop each eye daily as needed for itchy/watery eyes - can continue to use Systane for re-wetting drop - consider allergy shot course if medication management is not effective enough  Schedule skin testing and hold antihistamines for 3 days prior

## 2024-05-25 NOTE — Progress Notes (Signed)
 New Patient Note  RE: Kathryn French MRN: [id number] DOB: 09-14-1991 Date of Office Visit: 05/25/2024  Primary care provider: Howell Lunger, DO  Chief Complaint: Allergies, environmental/food  History of present illness: Kathryn French is a 33 y.o. female presenting today for evaluation of allergic rhinoconjunctivitis, food allergy.  Discussed the use of AI scribe software for clinical note transcription with the patient, who gave verbal consent to proceed.  She has been experiencing bumps on her eyelids for almost a year. These bumps are painful only when they become large and have not resolved despite using warm compresses. Certain foods, particularly shellfish like shrimp and lobster, seem to exacerbate the swelling of these bumps. She also suspects yucca and eggplant may contribute to her symptoms.  She has been to an eye doctor for these eye bumps and was advised regarding the warm compresses and to come see an allergist.   Her other allergy symptoms include a runny nose, itchy, watery eyes, sneezing, and throat clearing. She reports having sinus infections but does not normally need antibiotics.  She does report experiences headaches, primarily at the front and back of her head. She takes Benadryl occasionally, especially at night due to its sedative effects, and uses Systane eye drops regularly.  She has a history of diagnosed environmental allergies, including to trees, pollen, and grass, from testing she had done in elementary school. She was previously prescribed an EpiPen  but does not currently have one for these allergens. She has not undergone allergy shots due to time constraints during her school years. She has not experienced severe allergic reactions such as difficulty breathing or gastrointestinal symptoms.  Her diet has been adjusted to avoid shellfish and foods cooked in the same oil. She typically consumes yogurt, fruits, Caesar salads, and chicken wraps. She has not  identified other specific foods causing issues but has been eating clean to manage her symptoms.     No history of asthma or eczema.   Review of systems: 10pt ROS Negative unless noted above in HPI  Past medical history: Past Medical History:  Diagnosis Date   Angio-edema    Encounter for hepatitis C screening test for low risk patient 05/10/2020   Folliculitis 05/10/2020   Irregular menstrual bleeding 06/12/2020   Patient had nexplanon  placed for the first time in July 2020. Began to have spotting in July 2021 that lasted for about 2 weeks. Noticed brown discharge that required her to wear pads where she has to change them every few hours. Began to these symptoms again in September 2021 for a 5 day duration.    Need for immunization against influenza 10/23/2021   Prediabetes 12/23/2018   Urticaria     Past surgical history: Past Surgical History:  Procedure Laterality Date   RHINOPLASTY  2012   Dr. Jesus   SEPTOPLASTY  2012   Dr. Jesus   SINOSCOPY     TONSILECTOMY/ADENOIDECTOMY WITH MYRINGOTOMY  2011   WISDOM TOOTH EXTRACTION  2016    Family history:  Family History  Problem Relation Age of Onset   Diabetes Mother    Hypertension Mother    Hyperlipidemia Mother    Diabetes Father    Hypertension Father    Hyperlipidemia Father     Social history: Lives in a home without carpeting with electric heating and central cooling.  Dog in the home.  No concern for water damage, mildew or roaches in the home.  She is a Estate manager/land agent.  Denies smoking history.  Medication List: Current Outpatient Medications  Medication Sig Dispense Refill   Continuous Blood Gluc Receiver (DEXCOM G7 RECEIVER) DEVI 1 Device by Other route every 3 (three) months. 1 each 3   OZEMPIC , 0.25 OR 0.5 MG/DOSE, 2 MG/3ML SOPN INJECT 0.5MG  INTO THE SKIN ONCE WEEKLY 3 mL 3   atorvastatin  (LIPITOR) 20 MG tablet Take 1 tablet (20 mg total) by mouth daily. (Patient not taking: Reported on 05/25/2024) 90  tablet 3   Continuous Glucose Sensor (DEXCOM G7 SENSOR) MISC USE ONE SENSOR EVERY 10 DAYS 3 each 0   No current facility-administered medications for this visit.    Known medication allergies: No Known Allergies   Physical examination: Blood pressure 100/70, pulse 74, temperature 98.5 F (36.9 C), temperature source Temporal, resp. rate 18, height 5' 2.5 (1.588 m), weight 120 lb 3.2 oz (54.5 kg), SpO2 100%.  General: Alert, interactive, in no acute distress. HEENT: PERRLA, TMs pearly gray, turbinates non-edematous without discharge, post-pharynx non erythematous. Neck: Supple without lymphadenopathy. Lungs: Clear to auscultation without wheezing, rhonchi or rales. {no increased work of breathing. CV: Normal S1, S2 without murmurs. Abdomen: Nondistended, nontender. Skin: Warm and dry, without lesions or rashes. Extremities:  No clubbing, cyanosis or edema. Neuro:   Grossly intact.  Diagnostics/Labs: None today  Assessment and plan: Adverse food reaction Eyelid reaction/dermatitis - will plan to skin test to select foods identified on our food testing sheet - continue your current food avoidance for now - have access to epinephrine  device in case of allergic reaction.  Follow emergency action plan in case of reaction  Rhinoconjunctivitis - will plan to skin test to environmental allergens - recommend Xyzal 5mg  daily as your antihistamine - reserve benadryl for breakthrough symptoms - use Ryaltris  2 sprays each nostril twice a day as needed for nasal congestion or drainage.  This is a combination spray with Mometasone for congestion control and olopatadine for drainage control.   Aim tip bottle outward toward ear on same side nostril - use Pataday 1 drop each eye daily as needed for itchy/watery eyes - can continue to use Systane for re-wetting drop - consider allergy shot course if medication management is not effective enough  Schedule skin testing and hold antihistamines  for 3 days prior  (env 1-55 + select foods - see sheets in folder)  I appreciate the opportunity to take part in Nikita's care. Please do not hesitate to contact me with questions.  Sincerely,   Danita Brain, MD Allergy/Immunology [facility]

## 2024-05-26 ENCOUNTER — Telehealth: Payer: Self-pay

## 2024-05-26 ENCOUNTER — Other Ambulatory Visit (HOSPITAL_COMMUNITY): Payer: Self-pay

## 2024-05-26 NOTE — Telephone Encounter (Signed)
*  AA  Pharmacy Patient Advocate Encounter   Received notification from CoverMyMeds that prior authorization for EPINEPHrine  0.3MG /0.3ML auto-injectors  is required/requested.   Insurance verification completed.   The patient is insured through [place] .   Per test claim: The current 30 day co-pay is, $0.00.  No PA needed at this time. This test claim was processed through [facility]- copay amounts may vary at other pharmacies due to pharmacy/plan contracts, or as the patient moves through the different stages of their insurance plan.

## 2024-06-01 ENCOUNTER — Ambulatory Visit (INDEPENDENT_AMBULATORY_CARE_PROVIDER_SITE_OTHER): Admitting: Allergy

## 2024-06-01 ENCOUNTER — Encounter: Payer: Self-pay | Admitting: Allergy

## 2024-06-01 DIAGNOSIS — T781XXD Other adverse food reactions, not elsewhere classified, subsequent encounter: Secondary | ICD-10-CM

## 2024-06-01 DIAGNOSIS — J3089 Other allergic rhinitis: Secondary | ICD-10-CM | POA: Diagnosis not present

## 2024-06-01 DIAGNOSIS — H1013 Acute atopic conjunctivitis, bilateral: Secondary | ICD-10-CM | POA: Diagnosis not present

## 2024-06-01 DIAGNOSIS — H109 Unspecified conjunctivitis: Secondary | ICD-10-CM

## 2024-06-01 NOTE — Patient Instructions (Signed)
 Adverse food reaction - food allergy  skin testing is positive to sesame and lobster (lobster is cross-reactive with shrimp and crab).  Would strictly avoid these foods in diet - you also may have component of food intolerance (see below) - continue your current food avoidance for now - have access to epinephrine  device 0.3mg  in case of allergic reaction.  Follow emergency action plan in case of reaction    Allergy : food allergy  is when you have eaten a food, developed an allergic reaction after eating the food and have IgE to the food (positive food testing either by skin testing or blood testing).  Food allergy  could lead to life threatening symptoms  Sensitivity: occurs when you have IgE to a food (positive food testing either by skin testing or blood testing) but is a food you eat without any issues.  This is not an allergy  and we recommend keeping the food in the diet  Intolerance: this is when you have negative testing by either skin testing or blood testing thus not allergic but the food causes symptoms (like belly pain, bloating, diarrhea etc) with ingestion.  These foods should be avoided to prevent symptoms.     Environmental allergy  - recommend Xyzal 5mg  daily as your antihistamine - reserve benadryl for breakthrough symptoms - use Ryaltris  2 sprays each nostril twice a day as needed for nasal congestion or drainage.  This is a combination spray with Mometasone for congestion control and olopatadine for drainage control.   Aim tip bottle outward toward ear on same side nostril - use Pataday 1 drop each eye daily as needed for itchy/watery eyes - can continue to use Systane for re-wetting drop - Environmental allergy  skin testing today showed: grasses, trees, outdoor molds, and dust mites - Copy of test results provided.  - Avoidance measures provided. - Consider allergy  shots as a means of long-term control. - Allergy  shots re-train and reset the immune system to ignore  environmental allergens and decrease the resulting immune response to those allergens (sneezing, itchy watery eyes, runny nose, nasal congestion, etc).    - Allergy  shots improve symptoms in 75-85% of patients.  - We can discuss more at a future appointment if the medications are not working for you.   Please follow-up in 6 months or sooner if needed

## 2024-06-01 NOTE — Progress Notes (Signed)
 Follow-up Note  RE: Kathryn French MRN: [id number] DOB: December 19, 1990 Date of Office Visit: 06/01/2024   History of present illness: Kathryn French is a 33 y.o. female presenting today for skin testing visit.  She was last seen in the office on 05/25/24 for adverse food reaction, rhinoconjunctivitis.  She is in her usual state of health today without recent illness.  She has held antihistamines for at least 3 days for testing today.   Medication List: Current Outpatient Medications  Medication Sig Dispense Refill   atorvastatin  (LIPITOR) 20 MG tablet Take 1 tablet (20 mg total) by mouth daily. (Patient not taking: Reported on 05/25/2024) 90 tablet 3   Continuous Blood Gluc Receiver (DEXCOM G7 RECEIVER) DEVI 1 Device by Other route every 3 (three) months. 1 each 3   Continuous Glucose Sensor (DEXCOM G7 SENSOR) MISC USE ONE SENSOR EVERY 10 DAYS 3 each 0   EPINEPHrine  (EPIPEN  2-PAK) 0.3 mg/0.3 mL IJ SOAJ injection Inject 0.3 mg into the muscle as needed for anaphylaxis. 4 each 1   Olopatadine-Mometasone (RYALTRIS ) 665-25 MCG/ACT SUSP 2 sprays each nostril twice a day as needed for nasal congestion or drainage 29 g 1   OZEMPIC , 0.25 OR 0.5 MG/DOSE, 2 MG/3ML SOPN INJECT 0.5MG  INTO THE SKIN ONCE WEEKLY 3 mL 3   No current facility-administered medications for this visit.     Known medication allergies: No Known Allergies  Diagnostics/Labs:  Allergy  testing:   Airborne Adult Perc - 06/01/24 0925     Time Antigen Placed 9074    Allergen Manufacturer Jestine    Location Back    Number of Test 5    Panel 1 Select    1. Control-Buffer 50% Glycerol Negative    2. Control-Histamine 2+    3. Bahia Negative    4. [place] Negative    5. Johnson 2+    6. [place]  Blue Negative    7. Meadow Fescue Negative    8. Perennial Rye Negative    9. Timothy Negative    10. Ragweed Mix Negative    11. Cocklebur Negative    12. Plantain,  English Negative    13. Baccharis Negative    14. Dog  Fennel Negative    15. Russian Thistle Negative    16. Lamb's Quarters Negative    17. Sheep Sorrell Negative    18. Rough Pigweed Negative    19. Marsh Elder, Rough Negative    20. Mugwort, Common Negative    21. Box, Elder Negative    22. Cedar, red Negative    23. Sweet Gum Negative    24. Pecan Pollen 2+    25. Pine Mix Negative    26. Walnut, Black Pollen Negative    27. Red Mulberry Negative    28. Ash Mix Negative    29. Birch Mix Negative    30. Beech American Negative    31. Cottonwood, [place] Negative    32. Hickory, White Negative    33. Maple Mix Negative    34. Oak, [place] Mix Negative    35. Sycamore Eastern Negative    37. Cladosporium Herbarum Negative    38. Aspergillus Mix Negative    39. Penicillium Mix Negative    40. Bipolaris Sorokiniana (Helminthosporium) Negative    41. Drechslera Spicifera (Curvularia) Negative    42. Mucor Plumbeus 2+    43. Fusarium Moniliforme Negative    44. Aureobasidium Pullulans (pullulara) Negative    45. Rhizopus Oryzae Negative    46.  Botrytis Cinera Negative    47. Epicoccum Nigrum Negative    48. Phoma Betae Negative    49. Dust Mite Mix 4+    50. Cat Hair 10,000 BAU/ml Negative    51.  Dog Epithelia Negative    52. Mixed Feathers Negative    53. Horse Epithelia Negative    54. Cockroach, German Negative    55. Tobacco Leaf Negative          Food Adult Perc - 06/01/24 0900     Time Antigen Placed 9071    Allergen Manufacturer Jestine    Location Arm    Number of allergen test 11    Panel 2 Select    1. Peanut Negative    4. Sesame 2+    8. Shellfish Mix Negative    23. Shrimp Negative    24. Crab Negative    25. Lobster 2+    26. Oyster Negative    27. Scallops Negative    38. Tomato Negative    39. White Potato Negative    40. Sweet Potato Negative          Allergy  testing results were read and interpreted by provider, documented by clinical staff.   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  Would strictly avoid these foods in diet - you also may have component of food intolerance (see below) - continue your current food avoidance for now - have access to epinephrine  device 0.3mg  in case of allergic reaction.  Follow emergency action plan in case of reaction    Allergy : food allergy  is when you have eaten a food, developed an allergic reaction after eating the food and have IgE to the food (positive food testing either by skin testing or blood testing).  Food allergy  could lead to life threatening symptoms  Sensitivity: occurs when you have IgE to a food (positive food testing either by skin testing or blood testing) but is a food you eat without any issues.  This is not an allergy  and we recommend keeping the food in the diet  Intolerance: this is when you have negative testing by either skin testing or blood testing thus not allergic but the food causes symptoms (like belly pain, bloating, diarrhea etc) with ingestion.  These foods should be avoided to prevent symptoms.     Environmental allergy  - recommend Xyzal 5mg  daily as your antihistamine - reserve benadryl for breakthrough symptoms - use Ryaltris  2 sprays each nostril twice a day as needed for nasal congestion or drainage.  This is a combination spray with Mometasone for congestion control and olopatadine for drainage control.   Aim tip bottle outward toward ear on same side nostril - use Pataday 1 drop each eye daily as needed for itchy/watery eyes - can continue to use Systane for re-wetting drop - Environmental allergy  skin testing today showed: grasses, trees, outdoor molds, and dust mites - Copy of test results provided.  - Avoidance measures provided. - Consider allergy  shots as a means of long-term control. - Allergy  shots re-train and reset the immune system to ignore environmental allergens  and decrease the resulting immune response to those allergens (sneezing, itchy watery eyes, runny nose, nasal congestion, etc).    - Allergy  shots improve symptoms in 75-85% of patients.  - We can discuss more at a future appointment if the medications are not working for  you.   Please follow-up in 6 months or sooner if needed  I appreciate the opportunity to take part in Mika's care. Please do not hesitate to contact me with questions.  Sincerely,   Danita Brain, MD Allergy /Immunology [facility]

## 2024-06-19 ENCOUNTER — Other Ambulatory Visit: Payer: Self-pay | Admitting: Student

## 2024-06-19 DIAGNOSIS — E1169 Type 2 diabetes mellitus with other specified complication: Secondary | ICD-10-CM

## 2024-07-12 ENCOUNTER — Other Ambulatory Visit: Payer: Self-pay

## 2024-07-12 MED ORDER — OZEMPIC (0.25 OR 0.5 MG/DOSE) 2 MG/3ML ~~LOC~~ SOPN: 0.5000 mg | PEN_INJECTOR | SUBCUTANEOUS | 3 refills | Status: DC

## 2024-07-12 MED ORDER — OZEMPIC (0.25 OR 0.5 MG/DOSE) 2 MG/3ML ~~LOC~~ SOPN: 0.5000 mg | PEN_INJECTOR | SUBCUTANEOUS | 3 refills | Status: AC

## 2024-07-19 ENCOUNTER — Other Ambulatory Visit: Payer: Self-pay | Admitting: Student

## 2024-07-19 DIAGNOSIS — E1169 Type 2 diabetes mellitus with other specified complication: Secondary | ICD-10-CM

## 2024-08-14 ENCOUNTER — Other Ambulatory Visit: Payer: Self-pay | Admitting: Family Medicine

## 2024-08-14 DIAGNOSIS — E1169 Type 2 diabetes mellitus with other specified complication: Secondary | ICD-10-CM

## 2024-10-11 DIAGNOSIS — E1169 Type 2 diabetes mellitus with other specified complication: Secondary | ICD-10-CM

## 2024-10-27 ENCOUNTER — Other Ambulatory Visit: Payer: Self-pay

## 2024-10-27 ENCOUNTER — Emergency Department (HOSPITAL_COMMUNITY)
Admission: EM | Admit: 2024-10-27 | Discharge: 2024-10-27 | Disposition: A | Discharge status: Home / Self Care | Attending: Emergency Medicine | Admitting: Emergency Medicine

## 2024-10-27 ENCOUNTER — Encounter (HOSPITAL_COMMUNITY): Payer: Self-pay

## 2024-10-27 DIAGNOSIS — E11649 Type 2 diabetes mellitus with hypoglycemia without coma: Secondary | ICD-10-CM | POA: Diagnosis not present

## 2024-10-27 DIAGNOSIS — E162 Hypoglycemia, unspecified: Secondary | ICD-10-CM

## 2024-10-27 DIAGNOSIS — R55 Syncope and collapse: Secondary | ICD-10-CM | POA: Insufficient documentation

## 2024-10-27 DIAGNOSIS — D72829 Elevated white blood cell count, unspecified: Secondary | ICD-10-CM | POA: Insufficient documentation

## 2024-10-27 LAB — CBC WITH DIFFERENTIAL/PLATELET
Abs Immature Granulocytes: 0.04 K/uL (ref 0.00–0.07)
Basophils Absolute: 0 K/uL (ref 0.0–0.1)
Basophils Relative: 0 %
Eosinophils Absolute: 0.1 K/uL (ref 0.0–0.5)
Eosinophils Relative: 1 %
HCT: 40.7 % (ref 36.0–46.0)
Hemoglobin: 13.5 g/dL (ref 12.0–15.0)
Immature Granulocytes: 0 %
Lymphocytes Relative: 15 %
Lymphs Abs: 1.8 K/uL (ref 0.7–4.0)
MCH: 29.3 pg (ref 26.0–34.0)
MCHC: 33.2 g/dL (ref 30.0–36.0)
MCV: 88.5 fL (ref 80.0–100.0)
Monocytes Absolute: 0.5 K/uL (ref 0.1–1.0)
Monocytes Relative: 4 %
Neutro Abs: 9.6 K/uL — ABNORMAL HIGH (ref 1.7–7.7)
Neutrophils Relative %: 80 %
Platelets: 238 K/uL (ref 150–400)
RBC: 4.6 MIL/uL (ref 3.87–5.11)
RDW: 13.1 % (ref 11.5–15.5)
WBC: 12 K/uL — ABNORMAL HIGH (ref 4.0–10.5)
nRBC: 0 % (ref 0.0–0.2)

## 2024-10-27 LAB — URINALYSIS, ROUTINE W REFLEX MICROSCOPIC
Bilirubin Urine: NEGATIVE
Glucose, UA: NEGATIVE mg/dL
Hgb urine dipstick: NEGATIVE
Ketones, ur: NEGATIVE mg/dL
Nitrite: NEGATIVE
Protein, ur: NEGATIVE mg/dL
Specific Gravity, Urine: 1.01 (ref 1.005–1.030)
pH: 6 (ref 5.0–8.0)

## 2024-10-27 LAB — CBG MONITORING, ED
Glucose-Capillary: 95 mg/dL (ref 70–99)
Glucose-Capillary: 83 mg/dL (ref 70–99)
Glucose-Capillary: 114 mg/dL — ABNORMAL HIGH (ref 70–99)

## 2024-10-27 LAB — COMPREHENSIVE METABOLIC PANEL WITH GFR
ALT: 12 U/L (ref 0–44)
AST: 17 U/L (ref 15–41)
Albumin: 3.7 g/dL (ref 3.5–5.0)
Alkaline Phosphatase: 43 U/L (ref 38–126)
Anion gap: 8 (ref 5–15)
BUN: 10 mg/dL (ref 6–20)
CO2: 24 mmol/L (ref 22–32)
Calcium: 8.7 mg/dL — ABNORMAL LOW (ref 8.9–10.3)
Chloride: 107 mmol/L (ref 98–111)
Creatinine, Ser: 0.64 mg/dL (ref 0.44–1.00)
GFR, Estimated: >60 mL/min
Glucose, Bld: 92 mg/dL (ref 70–99)
Potassium: 3.7 mmol/L (ref 3.5–5.1)
Sodium: 139 mmol/L (ref 135–145)
Total Bilirubin: 0.4 mg/dL (ref 0.0–1.2)
Total Protein: 6.5 g/dL (ref 6.5–8.1)

## 2024-10-27 LAB — I-STAT CHEM 8, ED
BUN: 10 mg/dL (ref 6–20)
Calcium, Ion: 1.13 mmol/L — ABNORMAL LOW (ref 1.15–1.40)
Chloride: 105 mmol/L (ref 98–111)
Creatinine, Ser: 0.7 mg/dL (ref 0.44–1.00)
Glucose, Bld: 89 mg/dL (ref 70–99)
HCT: 40 % (ref 36.0–46.0)
Hemoglobin: 13.6 g/dL (ref 12.0–15.0)
Potassium: 3.6 mmol/L (ref 3.5–5.1)
Sodium: 140 mmol/L (ref 135–145)
TCO2: 23 mmol/L (ref 22–32)

## 2024-10-27 LAB — HCG, SERUM, QUALITATIVE: Preg, Serum: NEGATIVE

## 2024-10-27 NOTE — ED Notes (Signed)
 Keavie Chapman, [phone], brother

## 2024-10-27 NOTE — ED Provider Triage Note (Signed)
 Emergency Medicine Provider Triage Evaluation Note  Kathryn French , a 34 y.o. female  was evaluated in triage.  Pt complains of weakness. Patient was at work started to feel weak and out of it. Coworker states he gave her some soda, but she did not improve. CBG 97 here. No other complaints .  Review of Systems  Positive: Weakness  Negative: feve  Physical Exam  BP 116/74   Pulse 89   Temp 98.5 F (36.9 C)   SpO2 100%  Gen:   Lethargic , no distress   Resp:  Normal effort  MSK:   Moves extremities without difficulty  Other:    Medical Decision Making  Medically screening exam initiated at 12:55 PM.  Appropriate orders placed.  Kathryn French was informed that the remainder of the evaluation will be completed by another provider, this initial triage assessment does not replace that evaluation, and the importance of remaining in the ED until their evaluation is complete.  Will wathc the patietn in tr repeat cbg shortly   Arloa Chroman, PA-C 10/27/24 1300

## 2024-10-27 NOTE — Discharge Instructions (Signed)
 Your blood sugar has returned to normal and has been stable over the last 3 hours.  Please make sure you are eating regular meals, with Ozempic  this can certainly suppress your appetite but it is very important that you are eating regularly to prevent hypoglycemic episodes.  Please check your blood sugar every hour for the next few hours to ensure it is not dropping and make sure you eat a good sized dinner.  Follow-up closely with your doctor for ongoing management of your diabetes medications.

## 2024-10-27 NOTE — ED Triage Notes (Signed)
 Pt BIB EMS from work for a witnessed syncopal episode. Pt stated she felt hypoglycemic but is not diabetic. Pt does not complain of any pain but is lethargic.  Cbg 104

## 2024-10-28 NOTE — ED Provider Notes (Signed)
 " [facility] AT [facility] Provider Note   CSN: [id number] Arrival date & time: 10/27/24  1228     Patient presents with: Loss of Consciousness   Tyrese Mahadeo is a 34 y.o. female.   Aroura Plaza is a 34 y.o. female with history of non-insulin-dependent type 2 diabetes, who presents to the emergency department for evaluation after a witnessed syncopal episode while at work.  Patient stated that she started feeling lightheaded and unwell while at work checked her blood sugar and it was in the 40s, coworker gave her soda but then she passed out.  Her coworker helped her to the ground and she did not fall to the ground or hit her head.  After she came to she exhibits with her soda and with EMS her CBG was 104.  Patient reports she is still feeling a little weak, groggy and weak.  She denies any associated chest pain or palpitations.  She does report she has had some issues with hypoglycemia previously.  She thinks Ozempic  for the diabetes and took her dose this morning but reports that she has not had anything to eat all day she reports she was busy at work and had not had a chance to eat anything.  This has been the cause of her hypoglycemia in other instances as well.  No fevers or recent illness, no other aggravating or relieving factors.  The history is provided by the patient and medical records.  Loss of Consciousness Associated symptoms: no chest pain, no fever, no nausea, no shortness of breath and no vomiting        Prior to Admission medications  Medication Sig Start Date End Date Taking? Authorizing Provider  atorvastatin  (LIPITOR) 20 MG tablet Take 1 tablet (20 mg total) by mouth daily. Patient not taking: Reported on 05/25/2024 10/22/21   Macario Dorothyann HERO, MD  Continuous Blood Gluc Receiver (DEXCOM G7 RECEIVER) DEVI 1 Device by Other route every 3 (three) months. 12/05/21   Macario Dorothyann HERO, MD  Continuous Glucose Sensor (DEXCOM G7 SENSOR)  MISC APPLY A NEW SENSOR TO THE SKIN EVERY 10 DAYS AS DIRECTED 10/11/24   Howell Lunger, DO  EPINEPHrine  (EPIPEN  2-PAK) 0.3 mg/0.3 mL IJ SOAJ injection Inject 0.3 mg into the muscle as needed for anaphylaxis. 05/25/24   Jeneal Danita Macintosh, MD  Olopatadine-Mometasone (RYALTRIS ) 665-25 MCG/ACT SUSP 2 sprays each nostril twice a day as needed for nasal congestion or drainage 05/25/24   Jeneal Danita Macintosh, MD  Semaglutide ,0.25 or 0.5MG /DOS, (OZEMPIC , 0.25 OR 0.5 MG/DOSE,) 2 MG/3ML SOPN Inject 0.5 mg into the skin once a week. 07/12/24   Howell Lunger, DO    Allergies: Patient has no known allergies.    Review of Systems  Constitutional:  Negative for chills and fever.  Respiratory:  Negative for cough and shortness of breath.   Cardiovascular:  Positive for syncope. Negative for chest pain.  Gastrointestinal:  Negative for abdominal pain, nausea and vomiting.  Neurological:  Positive for syncope and light-headedness.  All other systems reviewed and are negative.   Updated Vital Signs BP 113/84   Pulse 83   Temp 98.6 F (37 C) (Oral)   Resp 19   SpO2 98%   Physical Exam Vitals and nursing note reviewed.  Constitutional:      General: She is not in acute distress.    Appearance: Normal appearance. She is well-developed. She is not diaphoretic.     Comments: After eating patient is now more  alert, in no acute distress  HENT:     Head: Normocephalic and atraumatic.     Mouth/Throat:     Mouth: Mucous membranes are moist.     Pharynx: Oropharynx is clear.  Eyes:     General:        Right eye: No discharge.        Left eye: No discharge.     Pupils: Pupils are equal, round, and reactive to light.  Cardiovascular:     Rate and Rhythm: Normal rate and regular rhythm.     Pulses: Normal pulses.     Heart sounds: Normal heart sounds.  Pulmonary:     Effort: Pulmonary effort is normal. No respiratory distress.     Breath sounds: Normal breath sounds. No wheezing or rales.      Comments: Respirations equal and unlabored, patient able to speak in full sentences, lungs clear to auscultation bilaterally  Abdominal:     General: Bowel sounds are normal. There is no distension.     Palpations: Abdomen is soft. There is no mass.     Tenderness: There is no abdominal tenderness. There is no guarding.     Comments: Abdomen soft, nondistended, nontender to palpation in all quadrants without guarding or peritoneal signs  Musculoskeletal:        General: No deformity.     Cervical back: Neck supple.  Skin:    General: Skin is warm and dry.     Capillary Refill: Capillary refill takes less than 2 seconds.  Neurological:     Mental Status: She is alert and oriented to person, place, and time.     Coordination: Coordination normal.     Comments: Speech is clear, able to follow commands CN III-XII intact Normal strength in upper and lower extremities bilaterally including dorsiflexion and plantar flexion, strong and equal grip strength Sensation normal to light and sharp touch Moves extremities without ataxia, coordination intact  Psychiatric:        Mood and Affect: Mood normal.        Behavior: Behavior normal.     (all labs ordered are listed, but only abnormal results are displayed) Labs Reviewed  COMPREHENSIVE METABOLIC PANEL WITH GFR - Abnormal; Notable for the following components:      Result Value   Calcium  8.7 (*)    All other components within normal limits  CBC WITH DIFFERENTIAL/PLATELET - Abnormal; Notable for the following components:   WBC 12.0 (*)    Neutro Abs 9.6 (*)    All other components within normal limits  URINALYSIS, ROUTINE W REFLEX MICROSCOPIC - Abnormal; Notable for the following components:   APPearance HAZY (*)    Leukocytes,Ua LARGE (*)    Bacteria, UA RARE (*)    All other components within normal limits  I-STAT CHEM 8, ED - Abnormal; Notable for the following components:   Calcium , Ion 1.13 (*)    All other components within  normal limits  CBG MONITORING, ED - Abnormal; Notable for the following components:   Glucose-Capillary 114 (*)    All other components within normal limits  HCG, SERUM, QUALITATIVE  CBG MONITORING, ED  CBG MONITORING, ED    EKG: None  Radiology: No results found.   Procedures   Medications Ordered in the ED - No data to display  Medical Decision Making  33 year old female with syncope and episode of hypoglycemia with CBG in the 40s while at work, patient did not hit her head and was helped to the floor by coworkers, did not eat at all the day after taking weekly dose of Ozempic .  After receiving so that the pro work worsened.  Both are doing so those transported them.  BG 97 on arrival.  Patient given peanut butter, graham crackers virtual visit regimen ginger ale will frequently recheck CBG and monitor closely.  Additional laboratory evaluation ordered as well which is overall reassuring minimal leukocytosis of 12 but no abnormal hemoglobin, no electrolyte derangements and normal renal and liver function.  Negative pregnancy.  Urinalysis with large leukocytes but with 6-10 squamous cells and no urinary symptoms I do have suspicion for contamination rather than urinary tract infection.   Without any head injury or cardiac or pulmonary symptoms do not feel that imaging of the head or chest is warranted.  EKG shows normal sinus rhythm and patient monitored monitored in the ED without any episodes of tachycardia or bradycardia.  Repeat CBG after 3 hours is 114.  Patient ambulated in the department with no further dizziness or near syncopal symptoms.  Given that patient has not had any additional episodes of hypoglycemia with this feeling improved she can be discharged home.  Stressed the importance of eating regular meals and following with the PCP to discuss potential adjustment of medications if she continues to have hypoglycemic episodes.  Discharged  home in good condition.     Final diagnoses:  Hypoglycemia  Syncope, unspecified syncope type    ED Discharge Orders     None          Alva Larraine JULIANNA DEVONNA 10/28/24 1217    Dasie Faden, MD 10/28/24 [phone]  "

## 2024-11-23 ENCOUNTER — Ambulatory Visit: Admitting: Allergy
